# Patient Record
Sex: Male | Born: 1990 | Race: White | Hispanic: No | Marital: Single | Smoking: Never smoker
Health system: Southern US, Community
[De-identification: ages and names within clinical notes are randomized; demographics above are authoritative.]

---
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
## 2010-05-06 ENCOUNTER — Ambulatory Visit: Payer: Self-pay | Admitting: Emergency Medicine

## 2010-09-16 NOTE — Assessment & Plan Note (Signed)
Summary: SORE THROAT/TJ   Vital Signs:  Patient Profile:   20 Years Old Male CC:      Cold & URI symptoms Height:     73 inches Weight:      149 pounds O2 Sat:      100 % O2 treatment:    Room Air Temp:     98.3 degrees F oral Pulse rate:   62 / minute Pulse rhythm:   regular Resp:     16 per minute BP sitting:   124 / 80  (left arm) Cuff size:   regular  Vitals Entered By: Areta Haber CMA (May 06, 2010 5:30 PM)                  Current Allergies: No known allergies History of Present Illness History from: patient & mother Chief Complaint: Cold & URI symptoms History of Present Illness: Patient complains of onset of cold symptoms for 1 days.  They have been using no OTC meds.  His girlfriend has strep and is being treated with antibioticst. + sore throat + cough No pleuritic pain No wheezing No nasal congestion No post-nasal drainage No sinus pain/pressure No itchy/red eyes No earache No hemoptysis No SOB No chills/sweats No nausea No vomiting No abdominal pain No diarrhea No skin rashes No fatigue No myalgias No headache   Current Problems: UPPER RESPIRATORY INFECTION, ACUTE (ICD-465.9)   Current Meds AMOXICILLIN 875 MG TABS (AMOXICILLIN) 1 tab by mouth two times a day for 10 days  REVIEW OF SYSTEMS Constitutional Symptoms      Denies fever, chills, night sweats, weight loss, weight gain, and fatigue.  Eyes       Denies change in vision, eye pain, eye discharge, glasses, contact lenses, and eye surgery. Ear/Nose/Throat/Mouth       Complains of sore throat and hoarseness.      Denies hearing loss/aids, change in hearing, ear pain, ear discharge, dizziness, frequent runny nose, frequent nose bleeds, sinus problems, and tooth pain or bleeding.      Comments: x 1 dy Respiratory       Complains of dry cough.      Denies productive cough, wheezing, shortness of breath, asthma, bronchitis, and emphysema/COPD.  Cardiovascular       Denies  murmurs, chest pain, and tires easily with exhertion.    Gastrointestinal       Denies stomach pain, nausea/vomiting, diarrhea, constipation, blood in bowel movements, and indigestion. Genitourniary       Denies painful urination, kidney stones, and loss of urinary control. Neurological       Complains of headaches.      Denies paralysis, seizures, and fainting/blackouts. Musculoskeletal       Denies muscle pain, joint pain, joint stiffness, decreased range of motion, redness, swelling, muscle weakness, and gout.  Skin       Denies bruising, unusual mles/lumps or sores, and hair/skin or nail changes.  Psych       Denies mood changes, temper/anger issues, anxiety/stress, speech problems, depression, and sleep problems.  Past History:  Past Medical History: Unremarkable  Past Surgical History: Denies surgical history  Social History: Single Never Smoked Alcohol use-no Drug use-no Regular exercise-yes Smoking Status:  never Drug Use:  no Does Patient Exercise:  yes Physical Exam General appearance: well developed, well nourished, no acute distress Nasal: clear discharge Oral/Pharynx: pharyngeal erythema without exudate, uvula midline without deviation.  moderate clear PND Neck: ant cerv tender LAD Chest/Lungs: no rales, wheezes, or rhonchi  bilateral, breath sounds equal without effort Heart: regular rate and  rhythm, no murmur Skin: no obvious rashes or lesions MSE: oriented to time, place, and person Assessment New Problems: UPPER RESPIRATORY INFECTION, ACUTE (ICD-465.9)  sore throat Rapid strep is negative  Patient Education: Patient and/or caregiver instructed in the following: rest, fluids, Tylenol prn, Ibuprofen prn. Sudafed, Claritin  Plan New Medications/Changes: AMOXICILLIN 875 MG TABS (AMOXICILLIN) 1 tab by mouth two times a day for 10 days  #20 x 0, 05/06/2010, Hoyt Koch MD  New Orders: New Patient Level III [id number] Rapid Strep  [60454] Planning Comments:   Take the antibiotics if not improving or worsening in 2-3 days   The patient and/or caregiver has been counseled thoroughly with regard to medications prescribed including dosage, schedule, interactions, rationale for use, and possible side effects and they verbalize understanding.  Diagnoses and expected course of recovery discussed and will return if not improved as expected or if the condition worsens. Patient and/or caregiver verbalized understanding.  Prescriptions: AMOXICILLIN 875 MG TABS (AMOXICILLIN) 1 tab by mouth two times a day for 10 days  #20 x 0   Entered and Authorized by:   Hoyt Koch MD   Signed by:   Hoyt Koch MD on 05/06/2010   Method used:   Print then Give to Patient   RxID:   [id number]   Orders Added: 1)  New Patient Level III [30865] 2)  Rapid Strep [78469]  Laboratory Results  Date/Time Received: May 06, 2010 6:02 PM  Date/Time Reported: May 06, 2010 6:02 PM   Other Tests  Rapid Strep: negative  Kit Test Internal QC: Negative   (Normal Range: Negative)

## 2011-02-02 ENCOUNTER — Encounter: Payer: Self-pay | Admitting: Family Medicine

## 2011-02-02 ENCOUNTER — Inpatient Hospital Stay (INDEPENDENT_AMBULATORY_CARE_PROVIDER_SITE_OTHER)
Admission: RE | Admit: 2011-02-02 | Discharge: 2011-02-02 | Disposition: A | Discharge status: Home / Self Care | Payer: BC Managed Care – PPO | Source: Ambulatory Visit | Attending: Family Medicine | Admitting: Family Medicine

## 2011-02-02 DIAGNOSIS — K4090 Unilateral inguinal hernia, without obstruction or gangrene, not specified as recurrent: Secondary | ICD-10-CM | POA: Insufficient documentation

## 2011-02-02 DIAGNOSIS — R109 Unspecified abdominal pain: Secondary | ICD-10-CM | POA: Insufficient documentation

## 2011-02-03 ENCOUNTER — Encounter (INDEPENDENT_AMBULATORY_CARE_PROVIDER_SITE_OTHER): Payer: Self-pay | Admitting: General Surgery

## 2011-03-02 ENCOUNTER — Other Ambulatory Visit (INDEPENDENT_AMBULATORY_CARE_PROVIDER_SITE_OTHER): Payer: Self-pay | Admitting: General Surgery

## 2011-03-02 ENCOUNTER — Encounter (HOSPITAL_COMMUNITY): Payer: BC Managed Care – PPO

## 2011-03-02 LAB — SURGICAL PCR SCREEN
MRSA, PCR: NEGATIVE
Staphylococcus aureus: NEGATIVE

## 2011-03-09 ENCOUNTER — Ambulatory Visit (HOSPITAL_COMMUNITY)
Admission: RE | Admit: 2011-03-09 | Discharge: 2011-03-09 | Disposition: A | Discharge status: Home / Self Care | Payer: BC Managed Care – PPO | Source: Ambulatory Visit | Attending: General Surgery | Admitting: General Surgery

## 2011-03-09 DIAGNOSIS — K4090 Unilateral inguinal hernia, without obstruction or gangrene, not specified as recurrent: Secondary | ICD-10-CM

## 2011-03-09 DIAGNOSIS — Z01812 Encounter for preprocedural laboratory examination: Secondary | ICD-10-CM | POA: Insufficient documentation

## 2011-03-09 HISTORY — PX: INGUINAL HERNIA REPAIR: SUR1180

## 2011-03-09 NOTE — Op Note (Signed)
Carlos Harvey, Carlos Harvey                ACCOUNT NO.:  [id number]  MEDICAL RECORD NO.:  [id number]  LOCATION:  DAYL                         FACILITY:  [facility]  PHYSICIAN:  Angelia Mould. Derrell Lolling, M.D.DATE OF BIRTH:  [birth year]  DATE OF PROCEDURE:  03/09/2011 DATE OF DISCHARGE:                              OPERATIVE REPORT   PREOPERATIVE DIAGNOSIS:  Left inguinal hernia.  POSTOPERATIVE DIAGNOSIS:  Left inguinal hernia.  OPERATION PERFORMED:  Laparoscopic repair of left inguinal hernia with mesh (TEP).  SURGEON:  Angelia Mould. Derrell Lolling, M.D.  OPERATIVE INDICATIONS:  This is a healthy 20 year old gentleman who presented recently with a painful left inguinal hernia.  He has no prior history of hernia problems but recently has developed a painful bulge in his left groin, especially when he is lifting at work.  This has never been incarcerated.  He was evaluated as an outpatient.  There was no evidence of hernia on the right side.  He wanted to go ahead and have this repaired because of his pain.  He is brought to operating room electively.  OPERATIVE TECHNIQUE:  Following induction of general endotracheal anesthesia, surgical time-out was held, identifying correct patient, correct procedure and correct site.  A Foley catheter was inserted, the bladder emptied, the Foley taped to his leg and the balloon deflated, catheter left in place temporarily.  The abdomen and genitalia were prepped and draped in sterile fashion.  Intravenous antibiotics were given.  Esperal was used as local infiltration anesthetic.  Transverse incision was made at the lower rim of the umbilicus.  The fascia was incised transversely, exposing the medial border of the left rectus muscle.  The space maker balloon was inserted into the left rectus sheath behind the left rectus muscle.  Video cam was inserted.  The balloon was inflated manually with air under direct vision.  We had good deployment of the balloon bilaterally.  We  had good visualization of the rectus muscles anteriorly, inferior epigastric vessels anteriorly, preperitoneal fat posteriorly, and Cooper's ligament inferiorly.  We held the balloon in place for 4 minutes.  The balloon was deflated and removed.  The trocar balloon was inflated and secured and connected to the insufflator at 13 mmHg.  The pneumoperitoneum was created.  We could see well.  5-mm trocar was placed in the midline below the umbilicus. Through this trocar, I cleaned off the fascia in the right lower quadrant and then inserted a second 5-mm trocar in the right lower quadrant.  Through these two trocars, I then cleaned off the left lower quadrant abdominal wall, taking the preperitoneal tissues away.  We identified the symphysis pubis and Cooper's ligament.  We isolated the cord structures.  We pulled the indirect hernia sac out and dissected this away from the cord structures.  The sac appeared to be a simple sac and there were no contents.  This was stripped away from the cord structures and dissected back above the level of the anterior-superior iliac spine.  We inspected the area above Cooper's ligament and there was no evidence of a direct hernia.  3 inches x 6 inches piece of Ultrapro mesh was inserted and then opened up transversely overlying the  inguinal floor.  The mesh was positioned so as to cross the midline slightly, across Cooper's ligament inferiorly slightly and with that the mesh deployed laterally very nicely.  The mesh was secured with a 5-mm titanium ProTack device.  We put about 4 tacks along the superior rim of Cooper's ligament, then up along the midline and then across the posterior belly of rectus muscle.  Lateral to the inferior epigastric vessels, we made sure that we could palpate the tacker each time we fired it to be sure that we were above the iliopubic tract.  This provided very secure repair of both medial and lateral to the internal ring.  We  could see the indirect sac well below the lower edge of the mesh.  I felt everything was satisfactory.  There was no bleeding.  The trocars were removed and pneumoperitoneum was released.  We had to place a Veress needle in the right upper quadrant to release pneumoperitoneum but that was done uneventfully.  The fascia at the umbilicus closed with two interrupted figure-of-eight sutures of 0 Vicryl.  All the skin incisions were closed with subcuticular sutures of 4-0 Monocryl and Dermabond. The patient tolerated the procedure well and was taken to recovery room in excellent condition.  Estimated blood loss was about 10 mL. Complications were none.  Sponge, needle and instrument counts were correct.     Angelia Mould. Derrell Lolling, M.D.     HMI/MEDQ  D:  03/09/2011  T:  03/09/2011  Job:  [id number]  cc:   Dr. Hassan Rowan  Electronically Signed by Claud Kelp M.D. on 03/09/2011 05:51:23 PM

## 2011-03-12 ENCOUNTER — Telehealth (INDEPENDENT_AMBULATORY_CARE_PROVIDER_SITE_OTHER): Payer: Self-pay

## 2011-03-12 NOTE — Telephone Encounter (Signed)
LMOM for pt to call for po appt. cs 

## 2011-04-03 ENCOUNTER — Encounter (INDEPENDENT_AMBULATORY_CARE_PROVIDER_SITE_OTHER): Payer: Self-pay | Admitting: Surgery

## 2011-04-03 ENCOUNTER — Ambulatory Visit (INDEPENDENT_AMBULATORY_CARE_PROVIDER_SITE_OTHER): Payer: Self-pay | Admitting: Surgery

## 2011-04-03 VITALS — BP 134/84 | HR 71 | Wt 173.0 lb

## 2011-04-03 DIAGNOSIS — K4090 Unilateral inguinal hernia, without obstruction or gangrene, not specified as recurrent: Secondary | ICD-10-CM | POA: Insufficient documentation

## 2011-04-03 NOTE — Patient Instructions (Signed)
  COCOA BUTTER & VITAMIN E CREAM  ([employer] or other brand)  Apply cocoa butter/vitamin E cream to your incision 2 - 3 times daily.  Massage cream into incision for one minute with each application.  Use sunscreen (50 SPF or higher) for first 6 months after surgery.  You may substitute Mederma or other scar reducing creams as desired.   

## 2011-04-03 NOTE — Progress Notes (Signed)
Visit Diagnoses: 1. Inguinal hernia unilateral, non-recurrent, left     HISTORY: Patient is a 20 year old white male who underwent laparoscopic left inguinal hernia repair 3 weeks ago. He returns for his postoperative visit.   EXAM: Examination of the abdomen shows 4 well-healed surgical incisions. No sign of infection. No sign of seroma. No sign of recurrent herniation. Palpation in the left inguinal canal with cough and Valsalva and shows no sign of recurrence.   IMPRESSION: Status post laparoscopic left inguinal hernia repair without complication   PLAN: Patient will gradually resume normal activities with our stricturing. He will apply topical creams to his incisions. He will return to see [place] as needed.   Velora Heckler, MD, FACS General & Endocrine Surgery [facility]

## 2011-07-20 NOTE — Progress Notes (Signed)
Summary: possible hernia (rm 2)   Vital Signs:  Patient Profile:   20 Years Old Male CC:      left inguinal pain x 3-4 days Height:     73 inches Weight:      171 pounds O2 Sat:      99 % O2 treatment:    Room Air Temp:     98.6 degrees F oral Pulse rate:   76 / minute Resp:     16 per minute BP sitting:   134 / 81  (left arm) Cuff size:   regular  Pt. in pain?   yes    Location:   left inguinal    Intensity:   8    Type:       sharp/sore  Vitals Entered By: Lajean Saver RN (February 02, 2011 4:59 PM)                   Prior Medication List:  AMOXICILLIN 875 MG TABS (AMOXICILLIN) 1 tab by mouth two times a day for 10 days   Updated Prior Medication List: No Medications Current Allergies: No known allergies History of Present Illness Chief Complaint: left inguinal pain x 3-4 days History of Present Illness: AS previously noted abdominal pain in L inguinal area for 3-4 days. No nausea or vomiting noted. Pain started while at work and continued.   Current Meds MOBIC 7.5 MG TABS (MELOXICAM) 1 po  q day as needed for pain  REVIEW OF SYSTEMS Constitutional Symptoms      Denies fever, chills, night sweats, weight loss, weight gain, and fatigue.  Eyes       Denies change in vision, eye pain, eye discharge, glasses, contact lenses, and eye surgery. Ear/Nose/Throat/Mouth       Denies hearing loss/aids, change in hearing, ear pain, ear discharge, dizziness, frequent runny nose, frequent nose bleeds, sinus problems, sore throat, hoarseness, and tooth pain or bleeding.  Respiratory       Denies dry cough, productive cough, wheezing, shortness of breath, asthma, bronchitis, and emphysema/COPD.  Cardiovascular       Denies murmurs, chest pain, and tires easily with exhertion.    Gastrointestinal       Denies stomach pain, nausea/vomiting, diarrhea, constipation, blood in bowel movements, and indigestion. Genitourniary       Denies painful urination, blood or discharge from  penis, kidney stones, and loss of urinary control. Neurological       Denies paralysis, seizures, and fainting/blackouts. Musculoskeletal       Complains of swelling.      Denies muscle pain, joint pain, joint stiffness, decreased range of motion, redness, muscle weakness, and gout.  Skin       Denies bruising, unusual mles/lumps or sores, and hair/skin or nail changes.  Psych       Denies mood changes, temper/anger issues, anxiety/stress, speech problems, depression, and sleep problems. Other Comments: left inguinal pain x 3-4 days. Swelling.    Past History:  Social History: Last updated: 05/06/2010 Single Never Smoked Alcohol use-no Drug use-no Regular exercise-yes  Risk Factors: Exercise: yes (05/06/2010)  Risk Factors: Smoking Status: never (05/06/2010)  Past Medical History: Reviewed history from 05/06/2010 and no changes required. Unremarkable  Past Surgical History: Reviewed history from 05/06/2010 and no changes required. Denies surgical history  Family History: Reviewed history and no changes required.  Social History: Reviewed history from 05/06/2010 and no changes required. Single Never Smoked Alcohol use-no Drug use-no Regular exercise-yes Physical Exam General appearance:  well developed, well nourished, no acute distress Head: normocephalic, atraumatic Abdomen: soft, non-tender without obvious organomegaly tenderness in the L inguinal ring area                                                                                                                Skin: no obvious rashes or lesions MSE: oriented to time, place, and person Assessment New Problems: INGUINAL HERNIA, LEFT (ICD-550.90) ABDOMINAL PAIN OTHER SPECIFIED SITE (ICD-789.09)  possible L inguinal        Patient Education: Patient and/or caregiver instructed in the following: rest fluids and Tylenol.  Plan New Medications/Changes: MOBIC 7.5 MG TABS (MELOXICAM) 1 po  q day as needed  for pain  #30 x 0, 02/02/2011, Hassan Rowan MD  New Orders: Surgical Referral [Surgery] Est. Patient Level III [phone] Planning Comments:   surgical treferal to be initiated  Follow Up: Follow up in 2-3 days if no improvement, Follow up on an as needed basis  The patient and/or caregiver has been counseled thoroughly with regard to medications prescribed including dosage, schedule, interactions, rationale for use, and possible side effects and they verbalize understanding.  Diagnoses and expected course of recovery discussed and will return if not improved as expected or if the condition worsens. Patient and/or caregiver verbalized understanding.  Prescriptions: MOBIC 7.5 MG TABS (MELOXICAM) 1 po  q day as needed for pain  #30 x 0   Entered and Authorized by:   Hassan Rowan MD   Signed by:   Hassan Rowan MD on 02/02/2011   Method used:   Printed then faxed to ...       [facility] [phone]* (retail)       [address] [place], [place]  [address]       Ph: [phone]       Fax: [phone]   RxID:   [id number]   Patient Instructions: 1)  if abdominal pain gets worse please go to local ED for futher evaluation 2)  Please se instructions about surgical referal  Orders Added: 1)  Surgical Referral [Surgery] 2)  Est. Patient Level III [56387]

## 2012-12-12 ENCOUNTER — Encounter: Payer: Self-pay | Admitting: *Deleted

## 2012-12-12 ENCOUNTER — Emergency Department
Admission: EM | Admit: 2012-12-12 | Discharge: 2012-12-12 | Disposition: A | Discharge status: Home / Self Care | Payer: BC Managed Care – PPO | Source: Home / Self Care | Attending: Family Medicine | Admitting: Family Medicine

## 2012-12-12 DIAGNOSIS — S8011XA Contusion of right lower leg, initial encounter: Secondary | ICD-10-CM

## 2012-12-12 DIAGNOSIS — S8010XA Contusion of unspecified lower leg, initial encounter: Secondary | ICD-10-CM

## 2012-12-12 NOTE — ED Provider Notes (Signed)
History     CSN: [id number]  Arrival date & time 12/12/12  1612   First MD Initiated Contact with Patient 12/12/12 1624      Chief Complaint  Patient presents with  . Leg Pain       HPI Comments: Patient bumped his right lower anterior leg on the edge of a container two days ago and has had persistent mild swelling and soreness.  No calf tenderness  Patient is a 22 y.o. male presenting with leg pain. The history is provided by the patient.  Leg Pain Location:  Leg Time since incident:  2 days Injury: yes   Leg location:  R lower leg Pain details:    Quality:  Burning   Radiates to:  Does not radiate   Severity:  Mild   Duration:  2 days   Timing:  Constant   Progression:  Improving Chronicity:  New Prior injury to area:  No Relieved by:  None tried Associated symptoms: swelling   Associated symptoms: no decreased ROM, no fatigue, no fever, no itching, no numbness, no stiffness and no tingling     Past Medical History  Diagnosis Date  . Inguinal hernia     left    Past Surgical History  Procedure Laterality Date  . Lipoma excision  2007    buttock  . Inguinal hernia repair  03/09/11    left    History reviewed. No pertinent family history.  History  Substance Use Topics  . Smoking status: Never Smoker   . Smokeless tobacco: Not on file  . Alcohol Use: No      Review of Systems  Constitutional: Negative for fever and fatigue.  Musculoskeletal: Negative for stiffness.  Skin: Negative for itching.  All other systems reviewed and are negative.    Allergies  Review of patient's allergies indicates no known allergies.  Home Medications   Current Outpatient Rx  Name  Route  Sig  Dispense  Refill  . meloxicam (MOBIC) 7.5 MG tablet               . oxyCODONE-acetaminophen (PERCOCET) 5-325 MG per tablet                 BP 139/84  Pulse 90  Temp(Src) 98.2 F (36.8 C) (Oral)  Resp 90  Wt 194 lb (87.998 kg)  BMI 25.6 kg/m2  SpO2  97%  Physical Exam  Nursing note and vitals reviewed. Constitutional: He is oriented to person, place, and time. He appears well-developed and well-nourished. No distress.  HENT:  Head: Atraumatic.  Eyes: Conjunctivae are normal. Pupils are equal, round, and reactive to light.  Cardiovascular: Normal heart sounds.   Pulmonary/Chest: Breath sounds normal.  Musculoskeletal: Normal range of motion. He exhibits tenderness. He exhibits no edema.       Legs: There is mild tenderness in right lateral anterior compartment as noted on diagram.  No calf tenderness.  Distal neurovascular function is intact.   Neurological: He is alert and oriented to person, place, and time.  Skin: Skin is warm.    ED Course  Procedures  none      1. Contusion of right lower leg, initial encounter       MDM  Ace wrap applied in graduated compression fashion; wear daytime until swelling resolves.  Wear ace wrap daily as instructed.  May continue to apply ice pack two or three times daily until swelling resolves.  May take Ibuprofen 200mg , 3 or 4 tabs every  8 hours with food.  Followup with [facility] if not improving about two weeks.        Lattie Haw, MD 12/16/12 1254

## 2012-12-12 NOTE — ED Notes (Signed)
Pt c/o RLE burning, pain, and numbness x 1 day after bumping it against a container.

## 2012-12-16 ENCOUNTER — Telehealth: Payer: Self-pay | Admitting: Emergency Medicine

## 2013-08-28 ENCOUNTER — Encounter: Payer: Self-pay | Admitting: Emergency Medicine

## 2013-08-28 ENCOUNTER — Other Ambulatory Visit: Payer: Self-pay | Admitting: Family Medicine

## 2013-08-28 ENCOUNTER — Emergency Department
Admission: EM | Admit: 2013-08-28 | Discharge: 2013-08-28 | Disposition: A | Discharge status: Home / Self Care | Payer: BC Managed Care – PPO | Source: Home / Self Care | Attending: Family Medicine | Admitting: Family Medicine

## 2013-08-28 DIAGNOSIS — Z2089 Contact with and (suspected) exposure to other communicable diseases: Secondary | ICD-10-CM

## 2013-08-28 DIAGNOSIS — Z202 Contact with and (suspected) exposure to infections with a predominantly sexual mode of transmission: Secondary | ICD-10-CM

## 2013-08-28 MED ORDER — OSELTAMIVIR PHOSPHATE 75 MG PO CAPS: 75.0000 mg | ORAL_CAPSULE | Freq: Two times a day (BID) | ORAL | Status: DC

## 2013-08-28 NOTE — ED Provider Notes (Signed)
CSN: [id number]     Arrival date & time 08/28/13  1445 History   First MD Initiated Contact with Patient 08/28/13 1520     Chief Complaint  Patient presents with  . Exposure to STD  . Chills      HPI Comments: Patient reports that he was possibly exposed to STD one month ago, and his partner tested positive for chlamydia.  He has had no GU symptoms. He also complains of feeling tired yesterday with chills, headache, and myalgias.  He feels well today, however.  Patient is a 23 y.o. male presenting with STD exposure. The history is provided by the patient.  Exposure to STD This is a new problem. Episode onset: 1 month ago. The problem has not changed since onset.Associated symptoms comments: none.    Past Medical History  Diagnosis Date  . Inguinal hernia     left   Past Surgical History  Procedure Laterality Date  . Lipoma excision  2007    buttock  . Inguinal hernia repair  03/09/11    left   No family history on file. History  Substance Use Topics  . Smoking status: Never Smoker   . Smokeless tobacco: Not on file  . Alcohol Use: No    Review of Systems  All other systems reviewed and are negative.    Allergies  Review of patient's allergies indicates not on file.  Home Medications   Current Outpatient Rx  Name  Route  Sig  Dispense  Refill  . aspirin-acetaminophen-caffeine (EXCEDRIN MIGRAINE) 250-250-65 MG per tablet   Oral   Take by mouth every 6 (six) hours as needed for headache.         . oseltamivir (TAMIFLU) 75 MG capsule   Oral   Take 1 capsule (75 mg total) by mouth every 12 (twelve) hours. (Rx void after 09/05/13)   10 capsule   0    BP 132/83  Pulse 72  Temp(Src) 98.3 F (36.8 C) (Oral)  Ht 6\' 1"  (1.854 m)  Wt 191 lb (86.637 kg)  BMI 25.20 kg/m2  SpO2 100% Physical Exam Nursing notes and Vital Signs reviewed. Appearance:  Patient appears healthy, stated age, and in no acute distress Eyes:  Pupils are equal, round, and reactive to light  and accomodation.  Extraocular movement is intact.  Conjunctivae are not inflamed  Ears:  Canals normal.  Tympanic membranes normal.  Nose:  Normal turbinates.  No sinus tenderness.  Mouth:  No lesions   Pharynx:  Normal Neck:  Supple.  No adenopathy Lungs:  Clear to auscultation.  Breath sounds are equal.  Heart:  Regular rate and rhythm without murmurs, rubs, or gallops.  Abdomen:  Nontender without masses or hepatosplenomegaly.  Bowel sounds are present.  No CVA or flank tenderness.  Extremities:  No edema.  No calf tenderness Skin:  No rash present.   ED Course  Procedures  None    Labs Reviewed  HIV ANTIBODY (ROUTINE TESTING)  GC/CHLAMYDIA PROBE AMP, URINE  RPR  HSV 1 ANTIBODY, IGG  HSV 2 ANTIBODY, IGG         MDM   1. Exposure to STD         Flu symptoms, now resolved.  Normal exam.  GC/chlamydia, RPR, HIV, HSV1, 2 pending Call if more distinct flu symptoms develop (will write Rx for Tamiflu).    Lattie HawStephen A Louvinia Cumbo, MD 08/29/13 [phone]

## 2013-08-28 NOTE — ED Notes (Signed)
Exposure to STD x 1 month ago, partner tested positive for chlamydia, denies sx no penial discharge, dysuria.

## 2013-08-28 NOTE — ED Notes (Signed)
Chills, body aches, migraine x 2 days

## 2013-08-28 NOTE — Discharge Instructions (Signed)
Begin Tamiflu if flu symptoms develop.

## 2013-08-29 ENCOUNTER — Telehealth: Payer: Self-pay | Admitting: *Deleted

## 2013-08-29 LAB — HIV ANTIBODY (ROUTINE TESTING W REFLEX): HIV: NONREACTIVE

## 2013-08-29 LAB — GC/CHLAMYDIA PROBE AMP
CT Probe RNA: POSITIVE — AB
GC PROBE AMP APTIMA: NEGATIVE

## 2013-08-29 LAB — RPR

## 2013-08-29 LAB — HSV 1 ANTIBODY, IGG: HSV 1 Glycoprotein G Ab, IgG: <0.1 IV

## 2013-08-29 LAB — HSV 2 ANTIBODY, IGG: HSV 2 Glycoprotein G Ab, IgG: <0.1 IV

## 2013-11-06 ENCOUNTER — Ambulatory Visit (INDEPENDENT_AMBULATORY_CARE_PROVIDER_SITE_OTHER): Payer: Self-pay

## 2013-11-06 ENCOUNTER — Other Ambulatory Visit: Payer: Self-pay | Admitting: Adult Health

## 2013-11-06 DIAGNOSIS — T1490XA Injury, unspecified, initial encounter: Secondary | ICD-10-CM

## 2013-11-06 DIAGNOSIS — X58XXXA Exposure to other specified factors, initial encounter: Secondary | ICD-10-CM

## 2013-11-07 ENCOUNTER — Other Ambulatory Visit: Payer: Self-pay | Admitting: Sports Medicine

## 2013-11-07 DIAGNOSIS — S022XXA Fracture of nasal bones, initial encounter for closed fracture: Secondary | ICD-10-CM | POA: Insufficient documentation
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
## 2013-11-08 ENCOUNTER — Encounter: Payer: Self-pay | Admitting: Sports Medicine

## 2013-11-08 ENCOUNTER — Ambulatory Visit (INDEPENDENT_AMBULATORY_CARE_PROVIDER_SITE_OTHER): Payer: BC Managed Care – PPO | Admitting: Sports Medicine

## 2013-11-08 VITALS — BP 150/83 | HR 84 | Wt 199.0 lb

## 2013-11-08 DIAGNOSIS — S060XAA Concussion with loss of consciousness status unknown, initial encounter: Secondary | ICD-10-CM | POA: Insufficient documentation

## 2013-11-08 DIAGNOSIS — S060X9A Concussion with loss of consciousness of unspecified duration, initial encounter: Secondary | ICD-10-CM

## 2013-11-08 DIAGNOSIS — S022XXA Fracture of nasal bones, initial encounter for closed fracture: Secondary | ICD-10-CM

## 2013-11-08 MED ORDER — HYDROCODONE-ACETAMINOPHEN 5-325 MG PO TABS: 1.0000 | ORAL_TABLET | Freq: Three times a day (TID) | ORAL | Status: DC | PRN

## 2013-11-08 NOTE — Assessment & Plan Note (Addendum)
This will heal well it's own. He does have some right-sided nasal obstruction likely due to swelling which I think will resolve. Icing, I do need a CT scan considering tenderness all the way around his maxilla and orbit. Hydrocodone for pain, he also has a concussion, out of work for a week.  I billed a fracture code for this visit, all subsequent visits for this complaint will be "post-op checks" in the global period.

## 2013-11-08 NOTE — Assessment & Plan Note (Signed)
Complete physical and cognitive rest, out of work for at least one week.

## 2013-11-08 NOTE — Progress Notes (Signed)
  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 He did lose consciousness, and had immediate pain and swelling, predominantly localized over his right nasal bone. He was seen in employer health, where x-rays showed suspicion for a nasal bone fracture. Unfortunately he also endorses some dizziness and difficulty concentrating, pain is moderate, persistent. We did order a CT scan yesterday, he is scheduled for today at approximately 1 PM.  Past medical history, Surgical history, Family history not pertinant except as noted below, Social history, Allergies, and medications have been entered into the medical record, reviewed, and no changes needed.   Review of Systems: No headache, visual changes, nausea, vomiting, diarrhea, constipation, dizziness, abdominal pain, skin rash, fevers, chills, night sweats, weight loss, swollen lymph nodes, body aches, joint swelling, muscle aches, chest pain, shortness of breath, mood changes, visual or auditory hallucinations.   Objective:   General: Well Developed, well nourished, and in no acute distress.  Neuro/Psych: Alert and oriented x3, extra-ocular muscles intact, able to move all 4 extremities, sensation grossly intact. He is unable to perform tandem or single leg balance. Skin: Warm and dry, no rashes noted.  Respiratory: Not using accessory muscles, speaking in full sentences, trachea midline.  Cardiovascular: Pulses palpable, no extremity edema. Abdomen: Does not appear distended. Face: There is bruising and swelling over his superior, lateral, and inferior orbits, there is also discrete tenderness to palpation without palpable deformity  over the right nasal bone. Pain is severe, persistent.   X-rays were personally reviewed and do show a nondisplaced fracture through the nasal bone.  Impression and Recommendations:   This case required medical decision making of moderate complexity.

## 2013-11-09 ENCOUNTER — Ambulatory Visit (INDEPENDENT_AMBULATORY_CARE_PROVIDER_SITE_OTHER): Payer: Worker's Compensation | Admitting: Sports Medicine

## 2013-11-09 ENCOUNTER — Encounter: Payer: Self-pay | Admitting: Sports Medicine

## 2013-11-09 VITALS — BP 151/83 | HR 70 | Ht 73.0 in | Wt 195.0 lb

## 2013-11-09 DIAGNOSIS — S060X9A Concussion with loss of consciousness of unspecified duration, initial encounter: Secondary | ICD-10-CM

## 2013-11-09 DIAGNOSIS — S060XAA Concussion with loss of consciousness status unknown, initial encounter: Secondary | ICD-10-CM

## 2013-11-09 DIAGNOSIS — M6740 Ganglion, unspecified site: Secondary | ICD-10-CM

## 2013-11-09 DIAGNOSIS — S022XXA Fracture of nasal bones, initial encounter for closed fracture: Secondary | ICD-10-CM

## 2013-11-09 NOTE — Progress Notes (Signed)
  Workers comp  Date of injury: November 06, 2013 Employer: [employer] Work Status: Out of work  Subjective:    CC: Followup CT results  HPI: Carlos Harvey returns for followup of an injury Carlos Harvey sustained at work, Carlos Harvey had an x-ray that was suggestive of a nasal bone fracture, and an exam with exquisite tenderness to palpation over the right nasal bones. Carlos Harvey did have a CT scan and returns with the disc for our review. Carlos Harvey continues to have concussive symptoms as well, moderate, persistent, Carlos Harvey has been doing his best with physical and cognitive rest and is out of work.  Past medical history, Surgical history, Family history not pertinant except as noted below, Social history, Allergies, and medications have been entered into the medical record, reviewed, and no changes needed.   Review of Systems: No fevers, chills, night sweats, weight loss, chest pain, or shortness of breath.   Objective:    General: Well Developed, well nourished, and in no acute distress.  Neuro: Alert and oriented x3, extra-ocular muscles intact, sensation grossly intact.  HEENT: Normocephalic, atraumatic, pupils equal round reactive to light, neck supple, no masses, no lymphadenopathy, thyroid nonpalpable. Continued bruising and swelling, Carlos Harvey still has tenderness to palpation without deformity over the right nasal bone. Skin: Warm and dry, no rashes. Cardiac: Regular rate and rhythm, no murmurs rubs or gallops, no lower extremity edema.  Respiratory: Clear to auscultation bilaterally. Not using accessory muscles, speaking in full sentences.  CT scan was personally reviewed, although the radiologist does not describe any fractures of the nasal bone, on the sagittal images, I do see a small defect that is nondisplaced in the right nasal bone cortex suggestive of a small fracture. There are no further fractures through the orbit or zygomatic process.  Impression and Recommendations:

## 2013-11-09 NOTE — Assessment & Plan Note (Signed)
Persistent, continue cognitive and physical rest. Return to see me in a week to reevaluate as well as do balance testing.

## 2013-11-09 NOTE — Assessment & Plan Note (Signed)
Causing a little bit of pain. Velcro wrist brace. We will avoid injection until after his nasal fracture has healed.

## 2013-11-09 NOTE — Assessment & Plan Note (Signed)
CT scan does confirm fracture through the nasal bone, nondisplaced. This will take approximately 4-6 weeks to heal. Icing, narcotics or as needed.

## 2013-11-16 ENCOUNTER — Ambulatory Visit (INDEPENDENT_AMBULATORY_CARE_PROVIDER_SITE_OTHER): Payer: Worker's Compensation | Admitting: Sports Medicine

## 2013-11-16 ENCOUNTER — Encounter: Payer: Self-pay | Admitting: Sports Medicine

## 2013-11-16 VITALS — BP 145/87 | HR 79 | Ht 73.0 in | Wt 195.0 lb

## 2013-11-16 DIAGNOSIS — M6740 Ganglion, unspecified site: Secondary | ICD-10-CM | POA: Diagnosis not present

## 2013-11-16 DIAGNOSIS — S022XXA Fracture of nasal bones, initial encounter for closed fracture: Secondary | ICD-10-CM

## 2013-11-16 DIAGNOSIS — S060X9A Concussion with loss of consciousness of unspecified duration, initial encounter: Secondary | ICD-10-CM

## 2013-11-16 DIAGNOSIS — S060XAA Concussion with loss of consciousness status unknown, initial encounter: Secondary | ICD-10-CM

## 2013-11-16 MED ORDER — HYDROCODONE-ACETAMINOPHEN 7.5-325 MG PO TABS: 1.0000 | ORAL_TABLET | Freq: Three times a day (TID) | ORAL | Status: DC | PRN

## 2013-11-16 NOTE — Assessment & Plan Note (Signed)
50% improved. Some mild continued vestibular symptoms and headaches. Has been doing okay with physical and cognitive rest, only uses computer, TV, cell phone for about one hour per day. I think he needs an additional week out of work.

## 2013-11-16 NOTE — Assessment & Plan Note (Signed)
Had to double up on hydrocodone due to pain. Refilling hydrocodone 7.5, #30.

## 2013-11-16 NOTE — Progress Notes (Signed)
  Workers comp  Date of injury: November 06, 2013 Employer: [employer] Work Status: Out of work  Subjective:    CC: Follow up  HPI: Carlos Harvey is now 9 days post closed fracture of his right nasal bone, nondisplaced. This is improving significantly.  Concussion: Improved approximately 50%, moderate compliance with physical and cognitive rest. Balance is still off.  Ganglion cyst: Improving with wrist sleeve.  Past medical history, Surgical history, Family history not pertinant except as noted below, Social history, Allergies, and medications have been entered into the medical record, reviewed, and no changes needed.   Review of Systems: No fevers, chills, night sweats, weight loss, chest pain, or shortness of breath.   Objective:    General: Well Developed, well nourished, and in no acute distress.  Neuro: Alert and oriented x3, extra-ocular muscles intact, sensation grossly intact.  HEENT: Normocephalic, atraumatic, pupils equal round reactive to light, neck supple, no masses, no lymphadenopathy, thyroid nonpalpable. No tenderness palpation over either nasal bone, or the orbit. Skin: Warm and dry, no rashes. Cardiac: Regular rate and rhythm, no murmurs rubs or gallops, no lower extremity edema.  Respiratory: Clear to auscultation bilaterally. Not using accessory muscles, speaking in full sentences.  Impression and Recommendations:

## 2013-11-16 NOTE — Assessment & Plan Note (Signed)
We can revisit this once the nasal fracture has healed.

## 2013-11-23 ENCOUNTER — Ambulatory Visit (INDEPENDENT_AMBULATORY_CARE_PROVIDER_SITE_OTHER): Payer: Worker's Compensation | Admitting: Sports Medicine

## 2013-11-23 ENCOUNTER — Encounter: Payer: Self-pay | Admitting: Sports Medicine

## 2013-11-23 VITALS — BP 137/86 | HR 88 | Ht 73.0 in | Wt 197.0 lb

## 2013-11-23 DIAGNOSIS — S060XAA Concussion with loss of consciousness status unknown, initial encounter: Secondary | ICD-10-CM

## 2013-11-23 DIAGNOSIS — S060X9A Concussion with loss of consciousness of unspecified duration, initial encounter: Secondary | ICD-10-CM

## 2013-11-23 DIAGNOSIS — S022XXA Fracture of nasal bones, initial encounter for closed fracture: Secondary | ICD-10-CM

## 2013-11-23 MED ORDER — HYDROCODONE-ACETAMINOPHEN 5-325 MG PO TABS: 1.0000 | ORAL_TABLET | Freq: Three times a day (TID) | ORAL | Status: DC | PRN

## 2013-11-23 NOTE — Progress Notes (Signed)
  Subjective:    CC: Followup  HPI: Concussion: Continues to improve, only has mild headaches, still a little off balance, has been doing moderately well with physical and cognitive rest.  Nasal bone fracture doing well, is not using nearly as much hydrocodone now.  Past medical history, Surgical history, Family history not pertinant except as noted below, Social history, Allergies, and medications have been entered into the medical record, reviewed, and no changes needed.   Review of Systems: No fevers, chills, night sweats, weight loss, chest pain, or shortness of breath.   Objective:    General: Well Developed, well nourished, and in no acute distress.  Neuro: Alert and oriented x3, extra-ocular muscles intact, sensation grossly intact.  HEENT: Normocephalic, atraumatic, pupils equal round reactive to light, neck supple, no masses, no lymphadenopathy, thyroid nonpalpable.  Skin: Warm and dry, no rashes. Cardiac: Regular rate and rhythm, no murmurs rubs or gallops, no lower extremity edema.  Respiratory: Clear to auscultation bilaterally. Not using accessory muscles, speaking in full sentences.  Balance testing reveals continued inability to balance with tandem stance.  Impression and Recommendations:

## 2013-11-23 NOTE — Assessment & Plan Note (Signed)
Persistent concussive symptoms, he did have inability to balance with tandem stance. I think he needs an additional 2 weeks out however I do believe we will be clearing him for work at his return visit.

## 2013-11-23 NOTE — Assessment & Plan Note (Signed)
Pain is decreased over 70% since the day of injury. I am going to decrease his hydrocodone to 5 mg, with use sparingly.

## 2013-11-29 ENCOUNTER — Encounter: Payer: Self-pay | Admitting: Sports Medicine

## 2013-12-01 ENCOUNTER — Encounter: Payer: Self-pay | Admitting: Emergency Medicine

## 2013-12-01 ENCOUNTER — Emergency Department
Admission: EM | Admit: 2013-12-01 | Discharge: 2013-12-01 | Disposition: A | Discharge status: Home / Self Care | Payer: BC Managed Care – PPO | Source: Home / Self Care | Attending: Family Medicine | Admitting: Family Medicine

## 2013-12-01 DIAGNOSIS — Z113 Encounter for screening for infections with a predominantly sexual mode of transmission: Secondary | ICD-10-CM

## 2013-12-01 DIAGNOSIS — B356 Tinea cruris: Secondary | ICD-10-CM

## 2013-12-01 MED ORDER — NAFTIFINE HCL 1 % EX CREA: TOPICAL_CREAM | Freq: Every day | CUTANEOUS | Status: DC

## 2013-12-01 NOTE — Discharge Instructions (Signed)
May apply 1% Hydrocortisone cream once or twice daily for itching.  May take oral Benadryl 50mg  at bedtime for itching.   Jock Itch Jock itch is a fungal infection of the skin in the groin area. It is sometimes called "ringworm" even though it is not caused by a worm. A fungus is a type of germ that thrives in dark, damp places.  CAUSES  This infection may spread from:  A fungus infection elsewhere on the body (such as athlete's foot).  Sharing towels or clothing. This infection is more common in:  Hot, humid climates.  People who wear tight-fitting clothing or wet bathing suits for long periods of time.  Athletes.  Overweight people.  People with diabetes. SYMPTOMS  Jock itch causes the following symptoms:  Red, pink or brown rash in the groin. Rash may spread to the thighs, anus, and buttocks.  Itching. DIAGNOSIS  Your caregiver may make the diagnosis by looking at the rash. Sometimes a skin scraping will be sent to test for fungus. Testing can be done either by looking under the microscope or by doing a culture (test to try to grow the fungus). A culture can take up to 2 weeks to come back. TREATMENT  Jock itch may be treated with:  Skin cream or ointment to kill fungus.  Medicine by mouth to kill fungus.  Skin cream or ointment to calm the itching.  Compresses or medicated powders to dry the infected skin. HOME CARE INSTRUCTIONS   Be sure to treat the rash completely. Follow your caregiver's instructions. It can take a couple of weeks to treat. If you do not treat the infection long enough, the rash can come back.  Wear loose-fitting clothing.  Men should wear cotton boxer shorts.  Women should wear cotton underwear.  Avoid hot baths.  Dry the groin area well after bathing. SEEK MEDICAL CARE IF:   Your rash is worse.  Your rash is spreading.  Your rash returns after treatment is finished.  Your rash is not gone in 4 weeks. Fungal infections are slow to  respond to treatment. Some redness may remain for several weeks after the fungus is gone. SEEK IMMEDIATE MEDICAL CARE IF:  The area becomes red, warm, tender, and swollen.  You have a fever. Document Released: 07/24/2002 Document Revised: 10/26/2011 Document Reviewed: 06/22/2008 [facility] Patient Information 2014 [employer], [place].

## 2013-12-01 NOTE — ED Provider Notes (Signed)
CSN: [id number]     Arrival date & time 12/01/13  1145 History   First MD Initiated Contact with Patient 12/01/13 1201     Chief Complaint  Patient presents with  . Pruritis      HPI Comments: Patient complains of two week history of pruritic rash in his groin area.  No vesicles.  No urethral discharge.  No testicular pain.  He feels well otherwise. He reports that he had a positive chlamydia test in January, and requests followup test.  Patient is a 23 y.o. male presenting with rash. The history is provided by the patient.  Rash Pain location: groin. Pain quality comment:  Itching Pain severity:  Mild Onset quality:  Gradual Duration:  2 days Timing:  Constant Progression:  Worsening Chronicity:  New Relieved by:  Nothing Worsened by:  Nothing tried Ineffective treatments:  None tried Associated symptoms: no chills, no dysuria, no fever, no hematuria and no nausea     Past Medical History  Diagnosis Date  . Inguinal hernia     left   Past Surgical History  Procedure Laterality Date  . Lipoma excision  2007    buttock  . Inguinal hernia repair  03/09/11    left   No family history on file. History  Substance Use Topics  . Smoking status: Never Smoker   . Smokeless tobacco: Not on file  . Alcohol Use: No    Review of Systems  Constitutional: Negative for fever and chills.  Gastrointestinal: Negative for nausea.  Genitourinary: Negative for dysuria and hematuria.  Skin: Positive for rash.  All other systems reviewed and are negative.   Allergies  Review of patient's allergies indicates no known allergies.  Home Medications   Prior to Admission medications   Medication Sig Start Date End Date Taking? Authorizing Provider  aspirin-acetaminophen-caffeine (EXCEDRIN MIGRAINE) 4348660028250-250-65 MG per tablet Take by mouth every 6 (six) hours as needed for headache.    Historical Provider, MD  HYDROcodone-acetaminophen (NORCO/VICODIN) 5-325 MG per tablet Take 1 tablet by  mouth every 8 (eight) hours as needed for moderate pain. 11/23/13   Monica Bectonhomas J Thekkekandam, MD   BP 131/83  Temp(Src) 98.1 F (36.7 C) (Oral)  Ht 6\' 1"  (1.854 m)  Wt 194 lb (87.998 kg)  BMI 25.60 kg/m2  SpO2 98% Physical Exam Nursing notes and Vital Signs reviewed. Appearance:  Patient appears healthy, stated age, and in no acute distress Eyes:  Pupils are equal, round, and reactive to light and accomodation.  Extraocular movement is intact.  Conjunctivae are not inflamed   Pharynx:  Normal Neck:  Supple.  No adenopathy Lungs:  Clear to auscultation.  Breath sounds are equal.  Heart:  Regular rate and rhythm without murmurs, rubs, or gallops.  Abdomen:  Nontender without masses or hepatosplenomegaly.  Bowel sounds are present.  No CVA or flank tenderness.  Genitourinary:  Penis normal without lesions or urethral discharge.  Scrotum is normal.  No regional lymphadenopathy palpated.  There is mild macular erythema in bilateral intertriginous inguinal areas extending to scrotum.  No fluourescence by Harriett RushWood's light ED Course  Procedures      Labs Reviewed  GC/CHLAMYDIA PROBE AMP, URINE    Results for orders placed during the hospital encounter of 08/28/13  HIV ANTIBODY (ROUTINE TESTING)      Result Value Ref Range   HIV NON REACTIVE  NON REACTIVE  RPR      Result Value Ref Range   RPR NON REAC  NON REAC  HSV 1 ANTIBODY, IGG      Result Value Ref Range   HSV 1 Glycoprotein G Ab, IgG <0.10    HSV 2 ANTIBODY, IGG      Result Value Ref Range   HSV 2 Glycoprotein G Ab, IgG <0.10          MDM   1. Tinea cruris   2. Screen for STD (sexually transmitted disease)    Begin Naftin cream for about two weeks Check GC/chlamyia for follow-up at patient's request May apply 1% Hydrocortisone cream once or twice daily for itching.  May take oral Benadryl 50mg  at bedtime for itching.    Lattie HawStephen A Lizmarie Witters, MD 12/03/13 1054

## 2013-12-01 NOTE — ED Notes (Signed)
Scrotum is itching x 2 days, was told January 23 to F/U in 2 weeks positive Chlamydia, that was 3 months ago, he is here today for Chlamydia check. I explained to him that in the 3 month period reinfection is possible and it's been to long for a F/U.

## 2013-12-02 LAB — GC/CHLAMYDIA PROBE AMP, URINE
Chlamydia, Swab/Urine, PCR: NEGATIVE
GC Probe Amp, Urine: NEGATIVE

## 2013-12-04 ENCOUNTER — Telehealth: Payer: Self-pay | Admitting: *Deleted

## 2013-12-07 ENCOUNTER — Encounter: Payer: Self-pay | Admitting: Sports Medicine

## 2013-12-07 ENCOUNTER — Ambulatory Visit (INDEPENDENT_AMBULATORY_CARE_PROVIDER_SITE_OTHER): Payer: Worker's Compensation | Admitting: Sports Medicine

## 2013-12-07 VITALS — BP 128/81 | HR 81 | Ht 73.0 in | Wt 191.0 lb

## 2013-12-07 DIAGNOSIS — S022XXA Fracture of nasal bones, initial encounter for closed fracture: Secondary | ICD-10-CM

## 2013-12-07 DIAGNOSIS — S060XAA Concussion with loss of consciousness status unknown, initial encounter: Secondary | ICD-10-CM

## 2013-12-07 DIAGNOSIS — S060X9A Concussion with loss of consciousness of unspecified duration, initial encounter: Secondary | ICD-10-CM

## 2013-12-07 NOTE — Progress Notes (Signed)
  Workers comp  Date of injury: November 06, 2013  Employer: [employer]  Work Status: Out of work  Subjective:    CC: Followup  HPI: The patient returns, he is approximately one month post injury at work, with concussion. His balance is better, headaches are gone, and his concentration is much better. He is eager to get back into work.  Nasal fracture: Symptoms have resolved.  Past medical history, Surgical history, Family history not pertinant except as noted below, Social history, Allergies, and medications have been entered into the medical record, reviewed, and no changes needed.   Review of Systems: No fevers, chills, night sweats, weight loss, chest pain, or shortness of breath.   Objective:    General: Well Developed, well nourished, and in no acute distress.  Neuro: Alert and oriented x3, extra-ocular muscles intact, sensation grossly intact.  HEENT: Normocephalic, atraumatic, pupils equal round reactive to light, neck supple, no masses, no lymphadenopathy, thyroid nonpalpable.  Skin: Warm and dry, no rashes. Cardiac: Regular rate and rhythm, no murmurs rubs or gallops, no lower extremity edema.  Respiratory: Clear to auscultation bilaterally. Not using accessory muscles, speaking in full sentences.  Impression and Recommendations:

## 2013-12-07 NOTE — Assessment & Plan Note (Signed)
Pain resolved.   

## 2013-12-07 NOTE — Assessment & Plan Note (Signed)
Overall resolved. At this point we are going to start getting him back into work. This means a half day for a week of light duty, and a full day for a week of light duty, then full duty. Like to see him back at least one more time after a month of full duty to see how things are going.

## 2014-01-02 ENCOUNTER — Emergency Department
Admission: EM | Admit: 2014-01-02 | Discharge: 2014-01-02 | Disposition: A | Discharge status: Home / Self Care | Payer: BC Managed Care – PPO | Source: Home / Self Care | Attending: Emergency Medicine | Admitting: Emergency Medicine

## 2014-01-02 ENCOUNTER — Encounter: Payer: Self-pay | Admitting: Emergency Medicine

## 2014-01-02 DIAGNOSIS — J029 Acute pharyngitis, unspecified: Secondary | ICD-10-CM

## 2014-01-02 DIAGNOSIS — J209 Acute bronchitis, unspecified: Secondary | ICD-10-CM

## 2014-01-02 LAB — POCT RAPID STREP A (OFFICE): Rapid Strep A Screen: NEGATIVE

## 2014-01-02 MED ORDER — AZITHROMYCIN 250 MG PO TABS: ORAL_TABLET | ORAL | Status: DC

## 2014-01-02 NOTE — ED Notes (Signed)
Pt c/o cough, bilateral ear ache, sore throat, and runny nose x 2 days. Denies fever.

## 2014-01-02 NOTE — ED Provider Notes (Signed)
CSN: [id number]     Arrival date & time 01/02/14  1238 History   First MD Initiated Contact with Patient 01/02/14 1310     Chief Complaint  Patient presents with  . Sore Throat  . Otalgia  . Cough   (Consider location/radiation/quality/duration/timing/severity/associated sxs/prior Treatment) HPI URI HISTORY  Carlos Harvey is a 23 y.o. male who complains of sore throat, sinus congestion, discolored nasal mucus, progressing to a cough productive of yellow-green sputum. Symptoms for 3 days.  Have been using over-the-counter treatment, OTC decongestant and cough med, which helps somewhat.  Denies history of asthma or chronic lung disease or recurrent URIs. He denies seasonal allergies  No chills/sweats +  Fever  +  Nasal congestion +  Discolored Post-nasal drainage No sinus pain/pressure Positive sore throat  +  cough Yesterday, had minimal wheezing, not today Positive chest congestion No hemoptysis No shortness of breath No pleuritic pain  No itchy/red eyes Mild bilateral earache  No nausea No vomiting No abdominal pain No diarrhea  No skin rashes +  Fatigue No myalgias No headache   Past Medical History  Diagnosis Date  . Inguinal hernia     left   Past Surgical History  Procedure Laterality Date  . Lipoma excision  2007    buttock  . Inguinal hernia repair  03/09/11    left   History reviewed. No pertinent family history. History  Substance Use Topics  . Smoking status: Never Smoker   . Smokeless tobacco: Not on file  . Alcohol Use: No    Review of Systems  Allergies  Review of patient's allergies indicates no known allergies.  Home Medications   Prior to Admission medications   Medication Sig Start Date End Date Taking? Authorizing Provider  pseudoephedrine-acetaminophen (TYLENOL SINUS) 30-500 MG TABS Take 1 tablet by mouth every 4 (four) hours as needed.   Yes Historical Provider, MD  azithromycin (ZITHROMAX Z-PAK) 250 MG tablet Take 2 tablets on  day one, then 1 tablet daily on days 2 through 5 01/02/14   Lajean Manesavid Massey, MD   BP 133/80  Pulse 100  Temp(Src) 98 F (36.7 C) (Oral)  Resp 18  Ht 6\' 1"  (1.854 m)  Wt 196 lb (88.905 kg)  BMI 25.86 kg/m2  SpO2 98% Physical Exam  Nursing note and vitals reviewed. Constitutional: He is oriented to person, place, and time. He appears well-developed and well-nourished. No distress.  HENT:  Head: Normocephalic and atraumatic.  Right Ear: Tympanic membrane, external ear and ear canal normal.  Left Ear: Tympanic membrane, external ear and ear canal normal.  Nose: Mucosal edema and rhinorrhea present. Right sinus exhibits maxillary sinus tenderness. Left sinus exhibits maxillary sinus tenderness.  Mouth/Throat: No oral lesions. No oropharyngeal exudate.  Posterior pharynx red without tonsillar enlargement or exudate  Eyes: Right eye exhibits no discharge. Left eye exhibits no discharge. No scleral icterus.  Neck: Neck supple.  Cardiovascular: Normal rate, regular rhythm and normal heart sounds.   Pulmonary/Chest: Effort normal. No respiratory distress. He has no wheezes. He has rhonchi. He has no rales.  Lymphadenopathy:    He has no cervical adenopathy.  Neurological: He is alert and oriented to person, place, and time.  Skin: Skin is warm and dry.    ED Course  Procedures (including critical care time) Labs Review Labs Reviewed  POCT RAPID STREP A (OFFICE)   Results for orders placed during the hospital encounter of 01/02/14  POCT RAPID STREP A (OFFICE)      Result  Value Ref Range   Rapid Strep A Screen Negative  Negative     Imaging Review No results found.   MDM   1. Acute pharyngitis   2. Acute bronchitis    Rapid strep test negative.  Treatment options discussed, as well as risks, benefits, alternatives. Patient voiced understanding and agreement with the following plans: Zithromax Z-PAK Other OTC symptomatic care discussed He declined any prescription cough  medication . Follow-up with your primary care doctor in 5-7 days if not improving, or sooner if symptoms become worse. Precautions discussed. Red flags discussed. Questions invited and answered. Patient voiced understanding and agreement.     Lajean Manesavid Massey, MD 01/02/14 1330

## 2014-01-18 ENCOUNTER — Encounter: Payer: Self-pay | Admitting: Sports Medicine

## 2014-01-18 ENCOUNTER — Ambulatory Visit (INDEPENDENT_AMBULATORY_CARE_PROVIDER_SITE_OTHER): Payer: Worker's Compensation | Admitting: Sports Medicine

## 2014-01-18 VITALS — BP 117/79 | HR 71 | Ht 73.0 in | Wt 200.0 lb

## 2014-01-18 DIAGNOSIS — M79604 Pain in right leg: Secondary | ICD-10-CM | POA: Insufficient documentation

## 2014-01-18 DIAGNOSIS — S022XXA Fracture of nasal bones, initial encounter for closed fracture: Secondary | ICD-10-CM

## 2014-01-18 DIAGNOSIS — S060XAA Concussion with loss of consciousness status unknown, initial encounter: Secondary | ICD-10-CM

## 2014-01-18 DIAGNOSIS — M79609 Pain in unspecified limb: Secondary | ICD-10-CM

## 2014-01-18 DIAGNOSIS — S060X9A Concussion with loss of consciousness of unspecified duration, initial encounter: Secondary | ICD-10-CM

## 2014-01-18 NOTE — Assessment & Plan Note (Signed)
Asymptomatic now with full duty at work. Clinically resolved.

## 2014-01-18 NOTE — Assessment & Plan Note (Signed)
Pain-free, cosmetically appears normal. Return as needed.

## 2014-01-18 NOTE — Assessment & Plan Note (Signed)
Occurs typically after a long day of work, localized over the tibialis anterior and likely represents a tendinitis. Strapped with compressive dressing. Return if no better in a week or 2.

## 2014-01-18 NOTE — Progress Notes (Signed)
  Subjective:    CC: Followup  HPI: Concussion: Resolved.  Nasal fracture: Resolved.  Right leg pain: Over the anterolateral aspect of the right lower leg, mild, only occurs when working for long periods of time. No radiation. Described as burning.  Past medical history, Surgical history, Family history not pertinant except as noted below, Social history, Allergies, and medications have been entered into the medical record, reviewed, and no changes needed.   Review of Systems: No fevers, chills, night sweats, weight loss, chest pain, or shortness of breath.   Objective:    General: Well Developed, well nourished, and in no acute distress.  Neuro: Alert and oriented x3, extra-ocular muscles intact, sensation grossly intact.  HEENT: Normocephalic, atraumatic, pupils equal round reactive to light, neck supple, no masses, no lymphadenopathy, thyroid nonpalpable.  Skin: Warm and dry, no rashes. Cardiac: Regular rate and rhythm, no murmurs rubs or gallops, no lower extremity edema.  Respiratory: Clear to auscultation bilaterally. Not using accessory muscles, speaking in full sentences.  Impression and Recommendations:

## 2014-01-23 ENCOUNTER — Encounter: Payer: Self-pay | Admitting: *Deleted

## 2014-06-11 ENCOUNTER — Other Ambulatory Visit: Payer: Self-pay | Admitting: Sports Medicine

## 2014-06-11 ENCOUNTER — Ambulatory Visit (INDEPENDENT_AMBULATORY_CARE_PROVIDER_SITE_OTHER): Payer: BC Managed Care – PPO

## 2014-06-11 ENCOUNTER — Encounter: Payer: Self-pay | Admitting: Sports Medicine

## 2014-06-11 ENCOUNTER — Ambulatory Visit (INDEPENDENT_AMBULATORY_CARE_PROVIDER_SITE_OTHER): Payer: BC Managed Care – PPO | Admitting: Sports Medicine

## 2014-06-11 VITALS — BP 130/69 | HR 79 | Ht 73.0 in | Wt 183.0 lb

## 2014-06-11 DIAGNOSIS — M25531 Pain in right wrist: Secondary | ICD-10-CM

## 2014-06-11 MED ORDER — PREDNISONE (PAK) 10 MG PO TABS: ORAL_TABLET | ORAL | Status: DC

## 2014-06-11 NOTE — Assessment & Plan Note (Addendum)
This represents a fourth extensor compartment tendinitis. Velcro wrist brace, prednisone taper, return in one week if no better and we can do a fourth extensor compartment injection. If he is better just return in one month.  Because he has had pain for one year, we would likely proceed with a rheumatoid workup and probable advanced imaging if no better in one month.

## 2014-06-11 NOTE — Progress Notes (Signed)
  Subjective:    CC: Wrist pain  HPI: Carlos Harvey is a very pleasant 23 year old male, for the past several days he has noted increasing pain and stiffness on the dorsum of his right wrist, worse with essentially any movement, no swelling, no trauma. No change in activity level.  Past medical history, Surgical history, Family history not pertinant except as noted below, Social history, Allergies, and medications have been entered into the medical record, reviewed, and no changes needed.   Review of Systems: No fevers, chills, night sweats, weight loss, chest pain, or shortness of breath.   Objective:    General: Well Developed, well nourished, and in no acute distress.  Neuro: Alert and oriented x3, extra-ocular muscles intact, sensation grossly intact.  HEENT: Normocephalic, atraumatic, pupils equal round reactive to light, neck supple, no masses, no lymphadenopathy, thyroid nonpalpable.  Skin: Warm and dry, no rashes. Cardiac: Regular rate and rhythm, no murmurs rubs or gallops, no lower extremity edema.  Respiratory: Clear to auscultation bilaterally. Not using accessory muscles, speaking in full sentences. Right Wrist: Inspection normal with no visible erythema or swelling. Range of motion is limited by pain, there is tenderness to palpation over the fourth extensor compartment. There is no reproduction of pain with resisted flexion or extension of the wrist however resisted extension of fingers 2 through 4 reproduce concordant pain. No snuffbox tenderness. No tenderness over Canal of [place]. Strength 5/5 in all directions without pain. Negative Finkelstein, tinel's and phalens. Negative Watson's test.  X-rays are negative.  Impression and Recommendations:

## 2014-07-09 ENCOUNTER — Ambulatory Visit (INDEPENDENT_AMBULATORY_CARE_PROVIDER_SITE_OTHER): Payer: BC Managed Care – PPO | Admitting: Sports Medicine

## 2014-07-09 ENCOUNTER — Encounter: Payer: Self-pay | Admitting: Sports Medicine

## 2014-07-09 VITALS — BP 122/66 | HR 72 | Wt 173.0 lb

## 2014-07-09 DIAGNOSIS — M25531 Pain in right wrist: Secondary | ICD-10-CM | POA: Diagnosis not present

## 2014-07-09 NOTE — Assessment & Plan Note (Signed)
Pain is referable to both the fourth extensor compartment and the wrist joint itself. Injections placed into the fourth extensor compartment, and radiocarpal joint. Return in one month. If persistent pain we will do a rheumatoid workup and MRI.

## 2014-07-09 NOTE — Progress Notes (Signed)
  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  Unfortunately he has failed anti-inflammatories, and immobilization. Pain is localized over the fourth extensor compartment as well.  Past medical history, Surgical history, Family history not pertinant except as noted below, Social history, Allergies, and medications have been entered into the medical record, reviewed, and no changes needed.   Review of Systems: No fevers, chills, night sweats, weight loss, chest pain, or shortness of breath.   Objective:    General: Well Developed, well nourished, and in no acute distress.  Neuro: Alert and oriented x3, extra-ocular muscles intact, sensation grossly intact.  HEENT: Normocephalic, atraumatic, pupils equal round reactive to light, neck supple, no masses, no lymphadenopathy, thyroid nonpalpable.  Skin: Warm and dry, no rashes. Cardiac: Regular rate and rhythm, no murmurs rubs or gallops, no lower extremity edema.  Respiratory: Clear to auscultation bilaterally. Not using accessory muscles, speaking in full sentences.  Procedure: Real-time Ultrasound Guided Injection of left fourth extensor compartment and left radiocarpal joint Device: GE Logiq E  Verbal informed consent obtained.  Time-out conducted.  Noted no overlying erythema, induration, or other signs of local infection.  Skin prepped in a sterile fashion.  Local anesthesia: Topical Ethyl chloride.  With sterile technique and under real time ultrasound guidance:  A total of 0.75 mL kenalog 40, 2 mL lidocaine injected easily into the radiocarpal joint, the needle was then redirected and 0.75 mL of Kenalog 40 and 2 mL of lidocaine was then injected into the overlying fourth extensor compartment, there was a significant effusion in the radiocarpal joint as  well as visible tenosynovitis in the fourth extensor compartment. Completed without difficulty  Pain immediately resolved suggesting accurate placement of the medication.  Advised to call if fevers/chills, erythema, induration, drainage, or persistent bleeding.  Images permanently stored and available for review in the ultrasound unit.  Impression: Technically successful ultrasound guided injection.  Impression and Recommendations:

## 2014-08-20 ENCOUNTER — Encounter: Payer: Self-pay | Admitting: Emergency Medicine

## 2014-08-20 ENCOUNTER — Emergency Department
Admission: EM | Admit: 2014-08-20 | Discharge: 2014-08-20 | Disposition: A | Discharge status: Home / Self Care | Payer: BLUE CROSS/BLUE SHIELD | Source: Home / Self Care | Attending: Emergency Medicine | Admitting: Emergency Medicine

## 2014-08-20 DIAGNOSIS — J209 Acute bronchitis, unspecified: Secondary | ICD-10-CM | POA: Diagnosis not present

## 2014-08-20 DIAGNOSIS — J40 Bronchitis, not specified as acute or chronic: Secondary | ICD-10-CM

## 2014-08-20 MED ORDER — AZITHROMYCIN 250 MG PO TABS: 250.0000 mg | ORAL_TABLET | Freq: Every day | ORAL | Status: DC

## 2014-08-20 MED ORDER — BENZONATATE 100 MG PO CAPS: 100.0000 mg | ORAL_CAPSULE | Freq: Three times a day (TID) | ORAL | Status: DC

## 2014-08-20 NOTE — ED Provider Notes (Signed)
CSN: [id number]     Arrival date & time 08/20/14  1559 History   First MD Initiated Contact with Patient 08/20/14 1610     Chief Complaint  Patient presents with  . Cough  . Wheezing  . Fatigue  . Generalized Body Aches  . Sore Throat   (Consider location/radiation/quality/duration/timing/severity/associated sxs/prior Treatment) Patient is a 24 y.o. male presenting with cough, wheezing, and pharyngitis. The history is provided by the patient. No language interpreter was used.  Cough Cough characteristics:  Non-productive Severity:  Moderate Onset quality:  Gradual Duration:  5 days Timing:  Constant Progression:  Worsening Chronicity:  New Smoker: no   Relieved by:  Nothing Worsened by:  Nothing tried Ineffective treatments:  None tried Associated symptoms: wheezing   Associated symptoms: no shortness of breath   Wheezing Associated symptoms: cough   Associated symptoms: no shortness of breath   Sore Throat Pertinent negatives include no shortness of breath.    Past Medical History  Diagnosis Date  . Inguinal hernia     left   Past Surgical History  Procedure Laterality Date  . Lipoma excision  2007    buttock  . Inguinal hernia repair  03/09/11    left   History reviewed. No pertinent family history. History  Substance Use Topics  . Smoking status: Never Smoker   . Smokeless tobacco: Not on file  . Alcohol Use: No    Review of Systems  Respiratory: Positive for cough and wheezing. Negative for shortness of breath.   All other systems reviewed and are negative.   Allergies  Review of patient's allergies indicates no known allergies.  Home Medications   Prior to Admission medications   Medication Sig Start Date End Date Taking? Authorizing Provider  azithromycin (ZITHROMAX) 250 MG tablet Take 1 tablet (250 mg total) by mouth daily. Take first 2 tablets together, then 1 every day until finished. 08/20/14   Elson Areas, PA-C  benzonatate (TESSALON) 100  MG capsule Take 1 capsule (100 mg total) by mouth every 8 (eight) hours. 08/20/14   Elson Areas, PA-C   BP 119/75 mmHg  Pulse 69  Temp(Src) 97.9 F (36.6 C) (Oral)  Resp 16  Ht  (1.854 m)  Wt 170 lb (77.111 kg)  BMI 22.43 kg/m2  SpO2 99% Physical Exam  Constitutional: He is oriented to person, place, and time. He appears well-developed and well-nourished.  HENT:  Head: Normocephalic.  Eyes: Conjunctivae and EOM are normal. Pupils are equal, round, and reactive to light.  Neck: Normal range of motion.  Cardiovascular: Normal rate and normal heart sounds.   Pulmonary/Chest: Effort normal and breath sounds normal.  Abdominal: Soft. He exhibits no distension.  Musculoskeletal: Normal range of motion.  Neurological: He is alert and oriented to person, place, and time.  Psychiatric: He has a normal mood and affect.  Nursing note and vitals reviewed.   ED Course  Procedures (including critical care time) Labs Review Labs Reviewed - No data to display  Imaging Review No results found.   MDM   1. Bronchitis    Rx for tessalon Rx for zithromax Follow up with Dr. Karie Schwalbe in one week if symptoms persist AVS    Elson Areas, PA-C 08/20/14 1744

## 2014-08-20 NOTE — ED Notes (Signed)
Reports 5 days of congestion, cough, aches, fatigue, chills/sweats without taking temp; taking alka-seltzer cough/cold.

## 2014-08-20 NOTE — Discharge Instructions (Signed)

## 2014-09-22 ENCOUNTER — Emergency Department
Admission: EM | Admit: 2014-09-22 | Discharge: 2014-09-22 | Disposition: A | Discharge status: Home / Self Care | Payer: BLUE CROSS/BLUE SHIELD | Source: Home / Self Care | Attending: Family Medicine | Admitting: Family Medicine

## 2014-09-22 DIAGNOSIS — R1110 Vomiting, unspecified: Secondary | ICD-10-CM

## 2014-09-22 DIAGNOSIS — F10120 Alcohol abuse with intoxication, uncomplicated: Secondary | ICD-10-CM

## 2014-09-22 MED ORDER — ONDANSETRON HCL 4 MG/2ML IJ SOLN
4.0000 mg | Freq: Once | INTRAMUSCULAR | Status: AC
  Administered 2014-09-22: 4 mg via INTRAMUSCULAR

## 2014-09-22 MED ORDER — PROMETHAZINE HCL 25 MG PO TABS: 25.0000 mg | ORAL_TABLET | Freq: Four times a day (QID) | ORAL | Status: DC | PRN

## 2014-09-22 NOTE — ED Notes (Signed)
Patient c/o nausea / vomiting. Patient states that he drank to much last night and now he cannot keep anything down.

## 2014-09-22 NOTE — Discharge Instructions (Signed)
Thank you for coming in today. Take Phenergan every 6 hours as needed for vomiting. Drink a little bit of fluid frequently. Return as needed. If your belly pain worsens, or you have high fever, bad vomiting, blood in your stool or black tarry stool go to the Emergency Room.   Nausea and Vomiting Nausea is a sick feeling that often comes before throwing up (vomiting). Vomiting is a reflex where stomach contents come out of your mouth. Vomiting can cause severe loss of body fluids (dehydration). Children and elderly adults can become dehydrated quickly, especially if they also have diarrhea. Nausea and vomiting are symptoms of a condition or disease. It is important to find the cause of your symptoms. CAUSES   Direct irritation of the stomach lining. This irritation can result from increased acid production (gastroesophageal reflux disease), infection, food poisoning, taking certain medicines (such as nonsteroidal anti-inflammatory drugs), alcohol use, or tobacco use.  Signals from the brain.These signals could be caused by a headache, heat exposure, an inner ear disturbance, increased pressure in the brain from injury, infection, a tumor, or a concussion, pain, emotional stimulus, or metabolic problems.  An obstruction in the gastrointestinal tract (bowel obstruction).  Illnesses such as diabetes, hepatitis, gallbladder problems, appendicitis, kidney problems, cancer, sepsis, atypical symptoms of a heart attack, or eating disorders.  Medical treatments such as chemotherapy and radiation.  Receiving medicine that makes you sleep (general anesthetic) during surgery. DIAGNOSIS Your caregiver may ask for tests to be done if the problems do not improve after a few days. Tests may also be done if symptoms are severe or if the reason for the nausea and vomiting is not clear. Tests may include:  Urine tests.  Blood tests.  Stool tests.  Cultures (to look for evidence of infection).  X-rays or  other imaging studies. Test results can help your caregiver make decisions about treatment or the need for additional tests. TREATMENT You need to stay well hydrated. Drink frequently but in small amounts.You may wish to drink water, sports drinks, clear broth, or eat frozen ice pops or gelatin dessert to help stay hydrated.When you eat, eating slowly may help prevent nausea.There are also some antinausea medicines that may help prevent nausea. HOME CARE INSTRUCTIONS   Take all medicine as directed by your caregiver.  If you do not have an appetite, do not force yourself to eat. However, you must continue to drink fluids.  If you have an appetite, eat a normal diet unless your caregiver tells you differently.  Eat a variety of complex carbohydrates (rice, wheat, potatoes, bread), lean meats, yogurt, fruits, and vegetables.  Avoid high-fat foods because they are more difficult to digest.  Drink enough water and fluids to keep your urine clear or pale yellow.  If you are dehydrated, ask your caregiver for specific rehydration instructions. Signs of dehydration may include:  Severe thirst.  Dry lips and mouth.  Dizziness.  Dark urine.  Decreasing urine frequency and amount.  Confusion.  Rapid breathing or pulse. SEEK IMMEDIATE MEDICAL CARE IF:   You have blood or brown flecks (like coffee grounds) in your vomit.  You have black or bloody stools.  You have a severe headache or stiff neck.  You are confused.  You have severe abdominal pain.  You have chest pain or trouble breathing.  You do not urinate at least once every 8 hours.  You develop cold or clammy skin.  You continue to vomit for longer than 24 to 48 hours.  You have a fever. MAKE SURE YOU:   Understand these instructions.  Will watch your condition.  Will get help right away if you are not doing well or get worse. Document Released: 08/03/2005 Document Revised: 10/26/2011 Document Reviewed:  12/31/2010 [facility] Patient Information 2015 [place], [place]. This information is not intended to replace advice given to you by your health care provider. Make sure you discuss any questions you have with your health care provider.

## 2014-09-22 NOTE — ED Provider Notes (Signed)
Carlos Harvey is a 24 y.o. male who presents to Urgent Care today for vomiting. Patient developed vomiting starting today after he drank too much alcohol last night. No chest pain shortness of breath abdominal pain and diarrhea. He is having trouble keeping any fluids down. He is still urinating. He feels well otherwise.   Past Medical History  Diagnosis Date  . Inguinal hernia     left   Past Surgical History  Procedure Laterality Date  . Lipoma excision  2007    buttock  . Inguinal hernia repair  03/09/11    left   History  Substance Use Topics  . Smoking status: Never Smoker   . Smokeless tobacco: Not on file  . Alcohol Use: No   ROS as above Medications: No current facility-administered medications for this encounter.   Current Outpatient Prescriptions  Medication Sig Dispense Refill  . promethazine (PHENERGAN) 25 MG tablet Take 1 tablet (25 mg total) by mouth every 6 (six) hours as needed for nausea or vomiting. 20 tablet 0   No Known Allergies   Exam:  BP 125/82 mmHg  Pulse 87  Temp(Src) 97.9 F (36.6 C) (Oral)  Ht 6' (1.829 m)  Wt 170 lb (77.111 kg)  BMI 23.05 kg/m2  SpO2 100% Gen: Well NAD HEENT: EOMI,  MMM Lungs: Normal work of breathing. CTABL normal posterior pharynx Heart: RRR no MRG Abd: NABS, Soft. Nondistended, Nontender Exts: Brisk capillary refill, warm and well perfused.   Patient was given 4 mg IM Zofran prior to discharge.  No results found for this or any previous visit (from the past 24 hour(s)). No results found.  Assessment and Plan: 24 y.o. male with vomiting due to hangover. Vital signs normal. Patient is well appearing. Treat with Zofran and as above and oral Phenergan at home. Return as needed.  Discussed warning signs or symptoms. Please see discharge instructions. Patient expresses understanding.     Rodolph BongEvan S Krysta Bloomfield, MD 09/22/14 [phone]

## 2014-11-19 ENCOUNTER — Encounter: Payer: Self-pay | Admitting: *Deleted

## 2014-11-19 ENCOUNTER — Emergency Department
Admission: EM | Admit: 2014-11-19 | Discharge: 2014-11-19 | Disposition: A | Discharge status: Home / Self Care | Payer: BLUE CROSS/BLUE SHIELD | Source: Home / Self Care | Attending: Family Medicine | Admitting: Family Medicine

## 2014-11-19 DIAGNOSIS — L989 Disorder of the skin and subcutaneous tissue, unspecified: Secondary | ICD-10-CM

## 2014-11-19 MED ORDER — SALICYLIC ACID 17 % EX SOLN: CUTANEOUS | Status: DC

## 2014-11-19 NOTE — Discharge Instructions (Signed)
Decrease frequency of application if surrounding skin irritation occurs.

## 2014-11-19 NOTE — ED Provider Notes (Signed)
CSN: [id number]     Arrival date & time 11/19/14  1522 History   First MD Initiated Contact with Patient 11/19/14 1621     Chief Complaint  Patient presents with  . Rash      HPI Comments: Patient complains of 2 year history of an occasionally recurring well localized "rash" on the tip of his left 4th finger.  In the past it has appeared for one to two months and partially cleared.  The lesion has now been present for about one month.  He describes the lesion as having a rough surface with tiny "holes."  No pain, swelling, warmth, drainage.  The lesion has not increased in size.  Patient is a 24 y.o. male presenting with rash. The history is provided by the patient.  Rash Location: left 4th finger. Quality: dryness   Quality: not blistering, not burning, not draining, not itchy, not painful, not peeling, not red, not scaling, not swelling and not weeping   Severity:  Mild Onset quality:  Gradual Duration:  1 month Timing:  Constant Progression:  Unchanged Chronicity:  Recurrent Context: not animal contact, not chemical exposure, not exposure to similar rash, not insect bite/sting, not medications, not new detergent/soap and not plant contact   Relieved by:  Nothing Worsened by:  Nothing tried Ineffective treatments:  None tried Associated symptoms comment:  None   Past Medical History  Diagnosis Date  . Inguinal hernia     left   Past Surgical History  Procedure Laterality Date  . Lipoma excision  2007    buttock  . Inguinal hernia repair  03/09/11    left   History reviewed. No pertinent family history. History  Substance Use Topics  . Smoking status: Never Smoker   . Smokeless tobacco: Not on file  . Alcohol Use: No    Review of Systems  Skin: Positive for rash.  All other systems reviewed and are negative.   Allergies  Review of patient's allergies indicates no known allergies.  Home Medications   Prior to Admission medications   Medication Sig Start Date  End Date Taking? Authorizing Provider  salicylic acid-lactic acid 17 % external solution Apply small amount to lesion at bedtime for up to two months. 11/19/14   Lattie HawStephen A Mekel Haverstock, MD   BP 134/77 mmHg  Pulse 71  Temp(Src) 98.3 F (36.8 C)  Resp 14  SpO2 100% Physical Exam  Constitutional: He is oriented to person, place, and time. He appears well-developed and well-nourished. He appears distressed.  HENT:  Head: Normocephalic.  Mouth/Throat: Oropharynx is clear and moist.  Eyes: Pupils are equal, round, and reactive to light.  Musculoskeletal:       Hands: Volar pad of left 4th fingertip has an oval patch about 6mm by 8mm with well defined border and scattered tiny black puncta on its surface.  The lesion is flesh colored and not raised.  No tenderness to palpation.  Neurological: He is alert and oriented to person, place, and time.  Skin: Skin is warm and dry.  Nursing note and vitals reviewed.   ED Course  Procedures  none   MDM   1. Lesion of finger; ?atypical verruca vulgrais    Begin application of salicylic acid solution at bedtime. Decrease frequency of application if surrounding skin irritation occurs. Followup with dermatologist if not improved 8 weeks.    Lattie HawStephen A Raylyn Speckman, MD 11/21/14 [phone]

## 2014-11-19 NOTE — ED Notes (Signed)
Carlos BroodBrent c/o skin disruption to tip of left 4th finger x 1 month. He describes it has small holes in his finger that he now believes is worsening. Denies drainage. C/o mild discomofort. This has happened in the past but resolved on its own.

## 2015-05-27 ENCOUNTER — Encounter: Payer: Self-pay | Admitting: *Deleted

## 2015-05-27 ENCOUNTER — Emergency Department (INDEPENDENT_AMBULATORY_CARE_PROVIDER_SITE_OTHER): Payer: BLUE CROSS/BLUE SHIELD

## 2015-05-27 ENCOUNTER — Emergency Department (INDEPENDENT_AMBULATORY_CARE_PROVIDER_SITE_OTHER)
Admission: EM | Admit: 2015-05-27 | Discharge: 2015-05-27 | Disposition: A | Discharge status: Home / Self Care | Payer: Worker's Compensation | Source: Home / Self Care | Attending: Family Medicine | Admitting: Family Medicine

## 2015-05-27 DIAGNOSIS — S39012A Strain of muscle, fascia and tendon of lower back, initial encounter: Secondary | ICD-10-CM

## 2015-05-27 DIAGNOSIS — S300XXA Contusion of lower back and pelvis, initial encounter: Secondary | ICD-10-CM

## 2015-05-27 DIAGNOSIS — M545 Low back pain: Secondary | ICD-10-CM

## 2015-05-27 MED ORDER — CYCLOBENZAPRINE HCL 10 MG PO TABS: ORAL_TABLET | ORAL | Status: DC

## 2015-05-27 MED ORDER — MELOXICAM 15 MG PO TABS: 15.0000 mg | ORAL_TABLET | Freq: Every day | ORAL | Status: DC

## 2015-05-27 MED ORDER — IBUPROFEN 800 MG PO TABS
800.0000 mg | ORAL_TABLET | Freq: Once | ORAL | Status: AC
  Administered 2015-05-27: 800 mg via ORAL

## 2015-05-27 NOTE — ED Notes (Signed)
Workers comp injury: Alon reports that he slipped and fell in a trailer on 05/24/15, and landed on his back. Today at 1600 he was cleaning up a trailer and his back "locked up " on him for 30 minutes. He c/o low back and hip pain going up his spine. No OTC meds.

## 2015-05-27 NOTE — ED Provider Notes (Signed)
CSN: [id number]     Arrival date & time 05/27/15  1821 History   First MD Initiated Contact with Patient 05/27/15 1848     Chief Complaint  Patient presents with  . Back Pain      HPI Comments: Two days ago while working in his trailer, patient slipped on wet floor and fell backwards.  His lower back struck the edge of a pallet.  He has had persistent pain/stiffness in his lower back that has improved over the past two days.  Today, however, while cleaning his trailer his back "locked up" and he has had increased pain/stiffness. The pain radiates somewhat to the right hip area.   He denies bowel or bladder dysfunction, and no saddle numbness.    Patient is a 24 y.o. male presenting with back pain. The history is provided by the patient.  Back Pain Location:  Lumbar spine and sacro-iliac joint Quality:  Aching and stiffness Stiffness is present:  All day Radiates to: right hip. Pain severity:  Moderate Pain is:  Same all the time Onset quality:  Gradual Duration:  2 days Timing:  Constant Progression:  Worsening Chronicity:  New Context: falling and occupational injury   Relieved by:  Nothing Worsened by:  Twisting, bending and ambulation Ineffective treatments:  Heating pad Associated symptoms: no abdominal pain, no abdominal swelling, no bladder incontinence, no bowel incontinence, no chest pain, no fever, no leg pain, no numbness, no paresthesias, no pelvic pain, no perianal numbness, no tingling and no weakness     Past Medical History  Diagnosis Date  . Inguinal hernia     left   Past Surgical History  Procedure Laterality Date  . Lipoma excision  2007    buttock  . Inguinal hernia repair  03/09/11    left   History reviewed. No pertinent family history. Social History  Substance Use Topics  . Smoking status: Never Smoker   . Smokeless tobacco: None  . Alcohol Use: No    Review of Systems  Constitutional: Negative for fever.  Cardiovascular: Negative for chest  pain.  Gastrointestinal: Negative for abdominal pain and bowel incontinence.  Genitourinary: Negative for bladder incontinence and pelvic pain.  Musculoskeletal: Positive for back pain.  Neurological: Negative for tingling, weakness, numbness and paresthesias.  All other systems reviewed and are negative.   Allergies  Review of patient's allergies indicates no known allergies.  Home Medications   Prior to Admission medications   Medication Sig Start Date End Date Taking? Authorizing Provider  cyclobenzaprine (FLEXERIL) 10 MG tablet Take one tab by mouth at bedtime for muscle spasm 05/27/15   Lattie Haw, MD  meloxicam (MOBIC) 15 MG tablet Take 1 tablet (15 mg total) by mouth daily. Take with food each morning 05/27/15   Lattie Haw, MD  salicylic acid-lactic acid 17 % external solution Apply small amount to lesion at bedtime for up to two months. 11/19/14   Lattie Haw, MD   Meds Ordered and Administered this Visit  Medications - No data to display  BP 128/86 mmHg  Pulse 87  Temp(Src) 98.3 F (36.8 C) (Oral)  Resp 16  Ht  (1.854 m)  Wt 165 lb (74.844 kg)  BMI 21.77 kg/m2  SpO2 100% No data found.   Physical Exam  Constitutional: He is oriented to person, place, and time. He appears well-developed and well-nourished. No distress.  HENT:  Head: Atraumatic.  Right Ear: External ear normal.  Left Ear: External ear normal.  Nose: Nose normal.  Mouth/Throat: Oropharynx is clear and moist.  Eyes: Conjunctivae and EOM are normal. Pupils are equal, round, and reactive to light.  Neck: Normal range of motion. Neck supple.  Cardiovascular: Normal heart sounds.   Pulmonary/Chest: Breath sounds normal.  Abdominal: There is no tenderness.  Musculoskeletal:       Lumbar back: He exhibits decreased range of motion, tenderness, bony tenderness and pain. He exhibits no swelling, no edema, no deformity, no laceration and no spasm.       Back:  Back:  Can heel/toe walk  and squat with difficulty.  Decreased forward flexion.  Tenderness in the midline from approximately L2 to sacrum, and tenderness in the paraspinous muscles from L3 to Sacral area, worse on the right. Straight leg raising test is negative.  Sitting knee extension test is negative.  Strength and sensation in the lower extremities is normal.  Patellar and achilles reflexes are normal.   Neurological: He is alert and oriented to person, place, and time.  Skin: Skin is warm and dry.  Nursing note and vitals reviewed.   ED Course  Procedures None    Imaging Review Dg Lumbar Spine Complete  05/27/2015   CLINICAL DATA:  24 year old male lumbar back pain from L2 to the sacrum after fall backwards on to palate at work 2 days ago. Symptoms greater on the right side. Initial encounter.  EXAM: LUMBAR SPINE - COMPLETE 4+ VIEW  COMPARISON:  None.  FINDINGS: Sequelae of ventral abdominal hernia repair with small metallic fasteners. Normal lumbar segmentation. Preserved vertebral height and alignment. Relatively preserved disc spaces. There is mild lumbar endplate degenerative spurring. No pars fracture. sacral ala and SI joints within normal limits. Visible lower thoracic levels appear intact.  IMPRESSION: No acute osseous abnormality identified in the lumbar spine.   Electronically Signed   By: Odessa Fleming M.D.   On: 05/27/2015 19:18      MDM   1. Contusion of lower back, initial encounter   2. Low back strain, initial encounter    Admistered ibuprofen  PO Begin Mobic  QAM, and Flexeril  HS Apply ice pack for 20 to 30 minutes, 3 to 4 times daily  Continue until pain decreases.  Avoid lifting, pushing, pulling.  Begin back exercises as tolerated Followup with [facility] in one week.  Work restrictions:  Light duty for one week.     Lattie Haw, MD 05/27/15 [phone]

## 2015-05-27 NOTE — Discharge Instructions (Signed)
Apply ice pack for 20 to 30 minutes, 3 to 4 times daily  Continue until pain decreases.  Avoid lifting, pushing, pulling.  Begin back exercises as tolerated   Contusion A contusion is a deep bruise. Contusions are the result of a blunt injury to tissues and muscle fibers under the skin. The injury causes bleeding under the skin. The skin overlying the contusion may turn blue, purple, or yellow. Minor injuries will give you a painless contusion, but more severe contusions may stay painful and swollen for a few weeks.  CAUSES  This condition is usually caused by a blow, trauma, or direct force to an area of the body. SYMPTOMS  Symptoms of this condition include:  Swelling of the injured area.  Pain and tenderness in the injured area.  Discoloration. The area may have redness and then turn blue, purple, or yellow. DIAGNOSIS  This condition is diagnosed based on a physical exam and medical history. An X-ray, CT scan, or MRI may be needed to determine if there are any associated injuries, such as broken bones (fractures). TREATMENT  Specific treatment for this condition depends on what area of the body was injured. In general, the best treatment for a contusion is resting, icing, applying pressure to (compression), and elevating the injured area. This is often called the RICE strategy. Over-the-counter anti-inflammatory medicines may also be recommended for pain control.  HOME CARE INSTRUCTIONS   Rest the injured area.  If directed, apply ice to the injured area:  Put ice in a plastic bag.  Place a towel between your skin and the bag.  Leave the ice on for 20 minutes, 2-3 times per day.  If directed, apply light compression to the injured area using an elastic bandage. Make sure the bandage is not wrapped too tightly. Remove and reapply the bandage as directed by your health care provider.  If possible, raise (elevate) the injured area above the level of your heart while you are sitting  or lying down.  Take over-the-counter and prescription medicines only as told by your health care provider. SEEK MEDICAL CARE IF:  Your symptoms do not improve after several days of treatment.  Your symptoms get worse.  You have difficulty moving the injured area. SEEK IMMEDIATE MEDICAL CARE IF:   You have severe pain.  You have numbness in a hand or foot.  Your hand or foot turns pale or cold.   This information is not intended to replace advice given to you by your health care provider. Make sure you discuss any questions you have with your health care provider.   Document Released: 05/13/2005 Document Revised: 04/24/2015 Document Reviewed: 12/19/2014 Elsevier Interactive Patient Education 2016 [employer]    Lumbosacral Strain Lumbosacral strain is a strain of any of the parts that make up your lumbosacral vertebrae. Your lumbosacral vertebrae are the bones that make up the lower third of your backbone. Your lumbosacral vertebrae are held together by muscles and tough, fibrous tissue (ligaments).  CAUSES  A sudden blow to your back can cause lumbosacral strain. Also, anything that causes an excessive stretch of the muscles in the low back can cause this strain. This is typically seen when people exert themselves strenuously, fall, lift heavy objects, bend, or crouch repeatedly. RISK FACTORS  Physically demanding work.  Participation in pushing or pulling sports or sports that require a sudden twist of the back (tennis, golf, baseball).  Weight lifting.  Excessive lower back curvature.  Forward-tilted pelvis.  Weak back  or abdominal muscles or both.  Tight hamstrings. SIGNS AND SYMPTOMS  Lumbosacral strain may cause pain in the area of your injury or pain that moves (radiates) down your leg.  DIAGNOSIS Your health care provider can often diagnose lumbosacral strain through a physical exam. In some cases, you may need tests such as X-ray exams.  TREATMENT    Treatment for your lower back injury depends on many factors that your clinician will have to evaluate. However, most treatment will include the use of anti-inflammatory medicines. HOME CARE INSTRUCTIONS   Avoid hard physical activities (tennis, racquetball, waterskiing) if you are not in proper physical condition for it. This may aggravate or create problems.  If you have a back problem, avoid sports requiring sudden body movements. Swimming and walking are generally safer activities.  Maintain good posture.  Maintain a healthy weight.  For acute conditions, you may put ice on the injured area.  Put ice in a plastic bag.  Place a towel between your skin and the bag.  Leave the ice on for 20 minutes, 2-3 times a day.  When the low back starts healing, stretching and strengthening exercises may be recommended. SEEK MEDICAL CARE IF:  Your back pain is getting worse.  You experience severe back pain not relieved with medicines. SEEK IMMEDIATE MEDICAL CARE IF:   You have numbness, tingling, weakness, or problems with the use of your arms or legs.  There is a change in bowel or bladder control.  You have increasing pain in any area of the body, including your belly (abdomen).  You notice shortness of breath, dizziness, or feel faint.  You feel sick to your stomach (nauseous), are throwing up (vomiting), or become sweaty.  You notice discoloration of your toes or legs, or your feet get very cold. MAKE SURE YOU:   Understand these instructions.  Will watch your condition.  Will get help right away if you are not doing well or get worse.   This information is not intended to replace advice given to you by your health care provider. Make sure you discuss any questions you have with your health care provider.   Document Released: 05/13/2005 Document Revised: 08/24/2014 Document Reviewed: 03/22/2013 Elsevier Interactive Patient Education [employer]

## 2015-06-03 ENCOUNTER — Emergency Department
Admission: EM | Admit: 2015-06-03 | Discharge: 2015-06-03 | Disposition: A | Discharge status: Home / Self Care | Payer: BLUE CROSS/BLUE SHIELD | Source: Home / Self Care | Attending: Family Medicine | Admitting: Family Medicine

## 2015-06-03 ENCOUNTER — Encounter: Payer: Self-pay | Admitting: *Deleted

## 2015-06-03 DIAGNOSIS — J36 Peritonsillar abscess: Secondary | ICD-10-CM | POA: Diagnosis not present

## 2015-06-03 DIAGNOSIS — J029 Acute pharyngitis, unspecified: Secondary | ICD-10-CM

## 2015-06-03 MED ORDER — HYDROCODONE-ACETAMINOPHEN 7.5-325 MG/15ML PO SOLN: 15.0000 mL | Freq: Four times a day (QID) | ORAL | Status: DC | PRN

## 2015-06-03 MED ORDER — CEFTRIAXONE SODIUM 1 G IJ SOLR
1.0000 g | Freq: Once | INTRAMUSCULAR | Status: AC
  Administered 2015-06-03: 1 g via INTRAMUSCULAR

## 2015-06-03 MED ORDER — CLINDAMYCIN HCL 150 MG PO CAPS: 300.0000 mg | ORAL_CAPSULE | Freq: Three times a day (TID) | ORAL | Status: DC

## 2015-06-03 MED ORDER — PREDNISONE 20 MG PO TABS: ORAL_TABLET | ORAL | Status: DC

## 2015-06-03 MED ORDER — DEXAMETHASONE SODIUM PHOSPHATE 10 MG/ML IJ SOLN: 10.0000 mg | Freq: Once | INTRAMUSCULAR | Status: DC

## 2015-06-03 MED ORDER — DEXAMETHASONE SODIUM PHOSPHATE 10 MG/ML IJ SOLN
10.0000 mg | Freq: Once | INTRAMUSCULAR | Status: AC
  Administered 2015-06-03: 10 mg via INTRAMUSCULAR

## 2015-06-03 MED ORDER — IBUPROFEN 100 MG/5ML PO SUSP: 600.0000 mg | Freq: Four times a day (QID) | ORAL | Status: DC | PRN

## 2015-06-03 NOTE — Discharge Instructions (Signed)
You were given a shot of Rocephin, an antibiotic, and Decadron, a steroid to help with swelling.  Please start your Clindamycin, antibiotic, today.  You may start the oral prednisone tomorrow.  Please stay well hydrated with cool liquids, popsicles, or ice chips.  It is "okay" if you do not eat much, as long as you stay well hydrated.  You may want to stick with soft foods to help reduce pain including yogurt, apple sauce, puddings, Jell-o, and soups.  Hycet (hydrocodone-acetaminophen) is a narcotic pain medication, do not combine these medications with others containing tylenol. While taking, do not drink alcohol, drive, or perform any other activities that requires focus while taking these medications.   If you develop difficulty breathing, unable to keep down fluids, please call 911 or go to closest emergency department immediately.   Peritonsillar Abscess A peritonsillar abscess is a collection of yellowish-white fluid (pus) in the back of the throat behind the tonsils. It usually occurs when an infection of the throat or tonsils (tonsillitis) spreads into the tissues around the tonsils. CAUSES The infection that leads to a peritonsillar abscess is usually caused by streptococcal bacteria.  SIGNS AND SYMPTOMS  Sore throat, often with pain on just one side.  Swelling and tenderness of the glands (lymph nodes) in the neck.  Difficulty swallowing.  Difficulty opening your mouth.  Fever.  Chills.  Drooling because of difficulty swallowing saliva.  Headache.  Changes in your voice.  Bad breath. DIAGNOSIS Your health care provider will take your medical history and do a physical exam. Imaging tests may be done, such as an ultrasound or CT scan. A sample of pus may be removed from the abscess using a needle (needle aspiration) or by swabbing the back of your throat. This sample will be sent to a lab for testing. TREATMENT Treatment usually involves draining the pus from the abscess.  This may be done through needle aspiration or by making an incision in the abscess. You will also likely need to take antibiotic medicine. HOME CARE INSTRUCTIONS  Rest as much as possible and get plenty of sleep.  Take medicines only as directed by your health care provider.  If you were prescribed an antibiotic medicine, finish it all even if you start to feel better.  If your abscess was drained by your health care provider, gargle with a mixture of salt and warm water:  Mix 1 tsp of salt in 8 oz of warm water.  Gargle with this mixture four times per day or as needed for comfort.  Do not swallow this mixture.  Drink plenty of fluids.  While your throat is sore, eat soft or liquid foods, such as frozen ice pops and ice cream.  Keep all follow-up visits as directed by your health care provider. This is important. SEEK MEDICAL CARE IF:  You have increased pain, swelling, redness, or drainage in your throat.  You develop a headache, a lack of energy (lethargy), or generalized feelings of illness.  You have a fever.  You feel dizzy.  You have difficulty swallowing or eating.  You show signs of becoming dehydrated, such as:  Light-headedness when standing.  Decreased urine output.  A fast heart rate.  Dry mouth. SEEK IMMEDIATE MEDICAL CARE IF:   You have difficulty talking or breathing, or you find it easier to breathe when you lean forward.  You are coughing up blood or vomiting blood.  You have severe throat pain that is not helped by medicines.  You  start to drool.   This information is not intended to replace advice given to you by your health care provider. Make sure you discuss any questions you have with your health care provider.   Document Released: 08/03/2005 Document Revised: 08/24/2014 Document Reviewed: 03/19/2014 Elsevier Interactive Patient Education [employer]

## 2015-06-03 NOTE — ED Notes (Signed)
Pt c/o sore throat on left side x 4 days. Painful to talk and swallow. Abscess to left tonsil.

## 2015-06-03 NOTE — ED Notes (Signed)
F/u apt with Dr. Benjamin Stainhekkekandam scheduled with Magda PaganiniAudrey for 06/05/15 @ 930am. Apt info given to patient while in office.

## 2015-06-03 NOTE — ED Provider Notes (Signed)
CSN: [id number]     Arrival date & time 06/03/15  1041 History   First MD Initiated Contact with Patient 06/03/15 1044     Chief Complaint  Patient presents with  . Sore Throat   (Consider location/radiation/quality/duration/timing/severity/associated sxs/prior Treatment) HPI Pt is a 24yo male [facility] with c/o gradually worsening sore throat that started 4 days ago.  He has associated generalized headache, Left ear pain, fever up to 101*F.  Pt states talking and swallowing make pain worse.  Pain is moderate to severe. No known sick contacts or recent travel.  He is able to keep down fluids but states it hurts to swallow.  Denies n/v/d.   Past Medical History  Diagnosis Date  . Inguinal hernia     left   Past Surgical History  Procedure Laterality Date  . Lipoma excision  2007    buttock  . Inguinal hernia repair  03/09/11    left   History reviewed. No pertinent family history. Social History  Substance Use Topics  . Smoking status: Never Smoker   . Smokeless tobacco: None  . Alcohol Use: No    Review of Systems  Constitutional: Positive for fever ( 101) and fatigue. Negative for chills.  HENT: Positive for ear pain (left) and sore throat. Negative for congestion, trouble swallowing and voice change.   Respiratory: Negative for cough and shortness of breath.   Cardiovascular: Negative for chest pain and palpitations.  Gastrointestinal: Negative for nausea, vomiting, abdominal pain and diarrhea.  Musculoskeletal: Positive for myalgias and arthralgias. Negative for back pain.  Skin: Negative for rash.  Neurological: Positive for dizziness and headaches. Negative for light-headedness.  All other systems reviewed and are negative.   Allergies  Review of patient's allergies indicates no known allergies.  Home Medications   Prior to Admission medications   Medication Sig Start Date End Date Taking? Authorizing Provider  clindamycin (CLEOCIN) 150 MG capsule Take  2 capsules (300 mg total) by mouth 3 (three) times daily. For 10 days 06/03/15   Junius FinnerErin O'Malley, PA-C  cyclobenzaprine (FLEXERIL) 10 MG tablet Take one tab by mouth at bedtime for muscle spasm 05/27/15   Lattie HawStephen A Beese, MD  HYDROcodone-acetaminophen (HYCET) 7.5-325 mg/15 ml solution Take 15 mLs by mouth every 6 (six) hours as needed for moderate pain. 06/03/15 06/02/16  Junius FinnerErin O'Malley, PA-C  ibuprofen (CHILD IBUPROFEN) 100 MG/5ML suspension Take 30 mLs (600 mg total) by mouth every 6 (six) hours as needed for fever, mild pain or moderate pain. 06/03/15   Junius FinnerErin O'Malley, PA-C  meloxicam (MOBIC) 15 MG tablet Take 1 tablet (15 mg total) by mouth daily. Take with food each morning 05/27/15   Lattie HawStephen A Beese, MD  predniSONE (DELTASONE) 20 MG tablet 3 tabs po day one, then 2 po daily x 4 days 06/03/15   Junius FinnerErin O'Malley, PA-C  salicylic acid-lactic acid 17 % external solution Apply small amount to lesion at bedtime for up to two months. 11/19/14   Lattie HawStephen A Beese, MD   Meds Ordered and Administered this Visit   Medications  cefTRIAXone (ROCEPHIN) injection 1 g (1 g Intramuscular Given 06/03/15 1119)  dexamethasone (DECADRON) injection 10 mg (10 mg Intramuscular Given 06/03/15 1120)    BP 121/78 mmHg  Pulse 105  Temp(Src) 98.6 F (37 C) (Oral)  Resp 16  Wt 156 lb (70.761 kg)  SpO2 100% No data found.   Physical Exam  Constitutional: He appears well-developed and well-nourished.  HENT:  Head: Normocephalic and atraumatic.  Right Ear: Hearing, tympanic membrane, external ear and ear canal normal.  Left Ear: Hearing, tympanic membrane, external ear and ear canal normal.  Nose: Nose normal.  Mouth/Throat: Uvula is midline and mucous membranes are normal. No trismus in the jaw. Oropharyngeal exudate, posterior oropharyngeal edema, posterior oropharyngeal erythema and tonsillar abscesses present.    Uvula midline. No airway involvement. Tonsillar erythema, edema and exudate with moderate to severe  edema of Left tonsil compared to Right c/w tonsillar abscess  Eyes: Conjunctivae are normal. No scleral icterus.  Neck: Normal range of motion. Neck supple.  Speech is clear, no muffled voice or stridor.  Cardiovascular: Normal rate, regular rhythm and normal heart sounds.   Pulmonary/Chest: Effort normal and breath sounds normal. No stridor. No respiratory distress. He has no wheezes. He has no rales. He exhibits no tenderness.  Abdominal: Soft. Bowel sounds are normal. He exhibits no distension and no mass. There is no tenderness. There is no rebound and no guarding.  Musculoskeletal: Normal range of motion.  Lymphadenopathy:    He has cervical adenopathy.  Neurological: He is alert.  Skin: Skin is warm and dry.  Nursing note and vitals reviewed.   ED Course  Procedures (including critical care time)  Labs Review Labs Reviewed - No data to display  Imaging Review No results found.    MDM   1. Tonsillar abscess   2. Acute pharyngitis, unspecified etiology    Pt presenting to [facility] with c/o worsening sore throat with Left sided tonsillar edema and exudate c/w tonsillar abscess. Uvula is midline however and airway is maintained. He is able to drink water in exam room w/o difficulty.   No strep test performed as pt will be started empirically on antibiotics for tonsillar abscess. Tx in UC: Decadron  IM and Rocephin 1g IM Rx: Clindamycin  TID for 10 days, prednisone  day 1,  x 4 days, liquid ibuprofen and liquid Hycet. Patient counseled on use of narcotic pain medications. Counseled not to combine these medications with others containing tylenol. Urged not to drink alcohol, drive, or perform any other activities that requires focus while taking these medications.   Home care instructions provided.  Strongly encouraged good hydration with PO fluids including ice chips and popsicles, and soft food diet until pain improves.  Pt is safe for discharge home. F/u with Dr.  Benjamin Stain at 9:30AM on Wednesday 06/05/15 for recheck of symptoms. Discussed signs/symptoms that warrant call to 911 or visit to closest emergency department.  Pt provided work note to return on Wednesday after his appointment.  Patient verbalized understanding and agreement with treatment plan.      Junius Finner, PA-C 06/03/15 1150

## 2015-06-04 ENCOUNTER — Telehealth: Payer: Self-pay | Admitting: *Deleted

## 2015-06-05 ENCOUNTER — Ambulatory Visit (INDEPENDENT_AMBULATORY_CARE_PROVIDER_SITE_OTHER): Payer: BLUE CROSS/BLUE SHIELD | Admitting: Sports Medicine

## 2015-06-05 ENCOUNTER — Encounter: Payer: Self-pay | Admitting: Sports Medicine

## 2015-06-05 VITALS — BP 130/79 | HR 61 | Temp 98.1°F | Wt 169.0 lb

## 2015-06-05 DIAGNOSIS — J36 Peritonsillar abscess: Secondary | ICD-10-CM | POA: Diagnosis not present

## 2015-06-05 DIAGNOSIS — M79604 Pain in right leg: Secondary | ICD-10-CM | POA: Diagnosis not present

## 2015-06-05 MED ORDER — CEFTRIAXONE SODIUM 1 G IJ SOLR
1.0000 g | Freq: Once | INTRAMUSCULAR | Status: AC
  Administered 2015-06-05: 1 g via INTRAMUSCULAR

## 2015-06-05 NOTE — Assessment & Plan Note (Signed)
Improving significantly on 300 mg of clindamycin 3 times a day, pain is well controlled. Symptoms continue to improve, he will continue the antibiotics, I am going to give him a booster of Rocephin today. Return to see me if not better at the conclusion of his course of antibiotics.

## 2015-06-05 NOTE — Assessment & Plan Note (Signed)
He will return to see me if this flares up.

## 2015-06-05 NOTE — Patient Instructions (Signed)
Peritonsillar Abscess °A peritonsillar abscess is a collection of yellowish-white fluid (pus) in the back of the throat behind the tonsils. It usually occurs when an infection of the throat or tonsils (tonsillitis) spreads into the tissues around the tonsils. °CAUSES °The infection that leads to a peritonsillar abscess is usually caused by streptococcal bacteria.  °SIGNS AND SYMPTOMS °· Sore throat, often with pain on just one side. °· Swelling and tenderness of the glands (lymph nodes) in the neck. °· Difficulty swallowing. °· Difficulty opening your mouth. °· Fever. °· Chills. °· Drooling because of difficulty swallowing saliva. °· Headache. °· Changes in your voice. °· Bad breath. °DIAGNOSIS °Your health care provider will take your medical history and do a physical exam. Imaging tests may be done, such as an ultrasound or CT scan. A sample of pus may be removed from the abscess using a needle (needle aspiration) or by swabbing the back of your throat. This sample will be sent to a lab for testing. °TREATMENT °Treatment usually involves draining the pus from the abscess. This may be done through needle aspiration or by making an incision in the abscess. You will also likely need to take antibiotic medicine. °HOME CARE INSTRUCTIONS °· Rest as much as possible and get plenty of sleep. °· Take medicines only as directed by your health care provider. °· If you were prescribed an antibiotic medicine, finish it all even if you start to feel better. °· If your abscess was drained by your health care provider, gargle with a mixture of salt and warm water: °¨ Mix 1 tsp of salt in 8 oz of warm water. °¨ Gargle with this mixture four times per day or as needed for comfort. °¨ Do not swallow this mixture. °· Drink plenty of fluids. °· While your throat is sore, eat soft or liquid foods, such as frozen ice pops and ice cream. °· Keep all follow-up visits as directed by your health care provider. This is important. °SEEK  MEDICAL CARE IF: °· You have increased pain, swelling, redness, or drainage in your throat. °· You develop a headache, a lack of energy (lethargy), or generalized feelings of illness. °· You have a fever. °· You feel dizzy. °· You have difficulty swallowing or eating. °· You show signs of becoming dehydrated, such as: °¨ Light-headedness when standing. °¨ Decreased urine output. °¨ A fast heart rate. °¨ Dry mouth. °SEEK IMMEDIATE MEDICAL CARE IF:  °· You have difficulty talking or breathing, or you find it easier to breathe when you lean forward. °· You are coughing up blood or vomiting blood. °· You have severe throat pain that is not helped by medicines. °· You start to drool. °  °This information is not intended to replace advice given to you by your health care provider. Make sure you discuss any questions you have with your health care provider. °  °Document Released: 08/03/2005 Document Revised: 08/24/2014 Document Reviewed: 03/19/2014 °Elsevier Interactive Patient Education ©2016 Elsevier Inc. ° °

## 2015-06-05 NOTE — Progress Notes (Signed)
  Subjective:    CC: Peritonsillar abscess  HPI: Carlos Harvey was seen in urgent care 2 days ago with swelling of his left peritonsillar region, he was speaking, breathing, and swallowing appropriately so he is treated conservatively as appropriate. He was started on clindamycin 300 mg 3 times a day, prednisone, and pain medication and is here today's later feeling significantly better, no further fevers and pain is resolving.  Past medical history, Surgical history, Family history not pertinant except as noted below, Social history, Allergies, and medications have been entered into the medical record, reviewed, and no changes needed.   Review of Systems: No fevers, chills, night sweats, weight loss, chest pain, or shortness of breath.   Objective:    General: Well Developed, well nourished, and in no acute distress. Speaking full sentences Neuro: Alert and oriented x3, extra-ocular muscles intact, sensation grossly intact.  HEENT: Normocephalic, atraumatic, pupils equal round reactive to light, neck supple, no masses, thyroid nonpalpable. Nasopharynx, ear canals are unremarkable, oropharynx shows visibly swollen peritonsillar abscess without overlying exudates, he also has significant tender left-sided cervical lymphadenopathy. Skin: Warm and dry, no rashes. Cardiac: Regular rate and rhythm, no murmurs rubs or gallops, no lower extremity edema.  Respiratory: Clear to auscultation bilaterally. Not using accessory muscles, speaking in full sentences.  Rocephin 1000 mg intramuscular here in the office.  Impression and Recommendations:

## 2015-10-05 ENCOUNTER — Emergency Department
Admission: EM | Admit: 2015-10-05 | Discharge: 2015-10-05 | Disposition: A | Discharge status: Home / Self Care | Payer: BLUE CROSS/BLUE SHIELD | Source: Home / Self Care | Attending: Family Medicine | Admitting: Family Medicine

## 2015-10-05 ENCOUNTER — Encounter: Payer: Self-pay | Admitting: Emergency Medicine

## 2015-10-05 DIAGNOSIS — J039 Acute tonsillitis, unspecified: Secondary | ICD-10-CM | POA: Diagnosis not present

## 2015-10-05 LAB — POCT RAPID STREP A (OFFICE): Rapid Strep A Screen: NEGATIVE

## 2015-10-05 MED ORDER — CEFTRIAXONE SODIUM 1 G IJ SOLR
1.0000 g | Freq: Once | INTRAMUSCULAR | Status: AC
  Administered 2015-10-05: 1 g via INTRAMUSCULAR

## 2015-10-05 MED ORDER — PREDNISONE 20 MG PO TABS: ORAL_TABLET | ORAL | Status: DC

## 2015-10-05 MED ORDER — DEXAMETHASONE SODIUM PHOSPHATE 10 MG/ML IJ SOLN
10.0000 mg | Freq: Once | INTRAMUSCULAR | Status: AC
  Administered 2015-10-05: 10 mg via INTRAMUSCULAR

## 2015-10-05 MED ORDER — HYDROCODONE-ACETAMINOPHEN 7.5-325 MG/15ML PO SOLN: 15.0000 mL | Freq: Four times a day (QID) | ORAL | Status: DC | PRN

## 2015-10-05 MED ORDER — CLINDAMYCIN HCL 300 MG PO CAPS: 300.0000 mg | ORAL_CAPSULE | Freq: Three times a day (TID) | ORAL | Status: DC

## 2015-10-05 NOTE — Discharge Instructions (Signed)
Try warm salt water gargles for sore throat.   If symptoms become significantly worse during the night or over the weekend, proceed to the local emergency room.  Recommend follow-up with ENT physician in 7 to 10 days.   Tonsillitis Tonsillitis is an infection of the throat that causes the tonsils to become red, tender, and swollen. Tonsils are collections of lymphoid tissue at the back of the throat. Each tonsil has crevices (crypts). Tonsils help fight nose and throat infections and keep infection from spreading to other parts of the body for the first 18 months of life.  CAUSES Sudden (acute) tonsillitis is usually caused by infection with streptococcal bacteria. Long-lasting (chronic) tonsillitis occurs when the crypts of the tonsils become filled with pieces of food and bacteria, which makes it easy for the tonsils to become repeatedly infected. SYMPTOMS  Symptoms of tonsillitis include:  A sore throat, with possible difficulty swallowing.  White patches on the tonsils.  Fever.  Tiredness.  New episodes of snoring during sleep, when you did not snore before.  Small, foul-smelling, yellowish-white pieces of material (tonsilloliths) that you occasionally cough up or spit out. The tonsilloliths can also cause you to have bad breath. DIAGNOSIS Tonsillitis can be diagnosed through a physical exam. Diagnosis can be confirmed with the results of lab tests, including a throat culture. TREATMENT  The goals of tonsillitis treatment include the reduction of the severity and duration of symptoms and prevention of associated conditions. Symptoms of tonsillitis can be improved with the use of steroids to reduce the swelling. Tonsillitis caused by bacteria can be treated with antibiotic medicines. Usually, treatment with antibiotic medicines is started before the cause of the tonsillitis is known. However, if it is determined that the cause is not bacterial, antibiotic medicines will not treat the  tonsillitis. If attacks of tonsillitis are severe and frequent, your health care provider may recommend surgery to remove the tonsils (tonsillectomy). HOME CARE INSTRUCTIONS   Rest as much as possible and get plenty of sleep.  Drink plenty of fluids. While the throat is very sore, eat soft foods or liquids, such as sherbet, soups, or instant breakfast drinks.  Eat frozen ice pops.  Gargle with a warm or cold liquid to help soothe the throat. Mix 1/4 teaspoon of salt and 1/4 teaspoon of baking soda in 8 oz of water. SEEK MEDICAL CARE IF:   Large, tender lumps develop in your neck.  A rash develops.  A green, yellow-brown, or bloody substance is coughed up.  You are unable to swallow liquids or food for 24 hours.  You notice that only one of the tonsils is swollen. SEEK IMMEDIATE MEDICAL CARE IF:   You develop any new symptoms such as vomiting, severe headache, stiff neck, chest pain, or trouble breathing or swallowing.  You have severe throat pain along with drooling or voice changes.  You have severe pain, unrelieved with recommended medications.  You are unable to fully open the mouth.  You develop redness, swelling, or severe pain anywhere in the neck.  You have a fever. MAKE SURE YOU:   Understand these instructions.  Will watch your condition.  Will get help right away if you are not doing well or get worse.   This information is not intended to replace advice given to you by your health care provider. Make sure you discuss any questions you have with your health care provider.   Document Released: 05/13/2005 Document Revised: 08/24/2014 Document Reviewed: 01/20/2013 Elsevier Interactive Patient Education  2016 [place].

## 2015-10-05 NOTE — ED Provider Notes (Signed)
CSN: [id number]     Arrival date & time 10/05/15  0865 History   First MD Initiated Contact with Patient 10/05/15 1020     Chief Complaint  Patient presents with  . Sore Throat      HPI Comments: Patient developed a recurrent severe sore throat yesterday, worse on the left.  He has difficulty talking and swallowing, but is able to swallow his saliva and fluids.  The history is provided by the patient.    Past Medical History  Diagnosis Date  . Inguinal hernia     left   Past Surgical History  Procedure Laterality Date  . Lipoma excision  2007    buttock  . Inguinal hernia repair  03/09/11    left   History reviewed. No pertinent family history. Social History  Substance Use Topics  . Smoking status: Never Smoker   . Smokeless tobacco: None  . Alcohol Use: No    Review of Systems + sore throat No cough No pleuritic pain No wheezing No nasal congestion ? post-nasal drainage No sinus pain/pressure No itchy/red eyes + earache + dizzy No hemoptysis + SOB + fever, + chills No nausea No vomiting No abdominal pain No diarrhea No urinary symptoms No skin rash + fatigue + myalgias No headache Used OTC meds without relief  Allergies  Review of patient's allergies indicates no known allergies.  Home Medications   Prior to Admission medications   Medication Sig Start Date End Date Taking? Authorizing Provider  clindamycin (CLEOCIN) 300 MG capsule Take 1 capsule (300 mg total) by mouth 3 (three) times daily. 10/05/15   Lattie Haw, MD  cyclobenzaprine (FLEXERIL) 10 MG tablet Take one tab by mouth at bedtime for muscle spasm 05/27/15   Lattie Haw, MD  HYDROcodone-acetaminophen (HYCET) 7.5-325 mg/15 ml solution Take 15 mLs by mouth every 6 (six) hours as needed for moderate pain. 10/05/15 10/04/16  Lattie Haw, MD  ibuprofen (CHILD IBUPROFEN) 100 MG/5ML suspension Take 30 mLs (600 mg total) by mouth every 6 (six) hours as needed for fever, mild pain or  moderate pain. 06/03/15   Junius Finner, PA-C  meloxicam (MOBIC) 15 MG tablet Take 1 tablet (15 mg total) by mouth daily. Take with food each morning 05/27/15   Lattie Haw, MD  predniSONE (DELTASONE) 20 MG tablet 3 tabs po day one, then 2 po daily x 4 days. Begin 10/06/15. 10/05/15   Lattie Haw, MD  salicylic acid-lactic acid 17 % external solution Apply small amount to lesion at bedtime for up to two months. 11/19/14   Lattie Haw, MD   Meds Ordered and Administered this Visit   Medications  dexamethasone (DECADRON) injection 10 mg (not administered)  cefTRIAXone (ROCEPHIN) injection 1 g (not administered)    BP 102/66 mmHg  Pulse 96  Temp(Src) 99.3 F (37.4 C) (Oral)  Resp 16  Ht  (1.854 m)  Wt 158 lb (71.668 kg)  BMI 20.85 kg/m2  SpO2 100% No data found.   Physical Exam Nursing notes and Vital Signs reviewed. Appearance:  Patient appears stated age, and in no acute distress.  He is alert and oriented.  Eyes:  Pupils are equal, round, and reactive to light and accomodation.  Extraocular movement is intact.  Conjunctivae are not inflamed  Ears:  Canals normal.  Tympanic membranes normal.  Nose:   Normal turbinates.  No sinus tenderness.   Mouth:  Moist mucous membranes; mild trismus present  Pharynx:  Erythematous  with exudate.  Tonsils swollen, left larger than right, without obstruction.  Patient able to swallow his saliva Neck:  Supple.  Tender enlarged tonsillar and posterior nodes are palpated bilaterally, more prominent on left.  Lungs:  Clear to auscultation.  Breath sounds are equal.  Moving air well. Heart:  Regular rate and rhythm without murmurs, rubs, or gallops.  Abdomen:  Nontender without masses or hepatosplenomegaly.  Bowel sounds are present.  No CVA or flank tenderness.  Extremities:  No edema.  Skin:  No rash present.   ED Course  Procedures  None    Labs Reviewed  POCT RAPID STREP A (OFFICE) negative     MDM   1. Acute tonsillitis,  unspecified etiology; recurrent    Decadron  IM.  Rocephin 1gm IM Begin prednisone burst tomorrow.  Begin Clindamycin  TID Try warm salt water gargles for sore throat.   If symptoms become significantly worse during the night or over the weekend, proceed to the local emergency room.  Followup with Dr. Rodney Langton in 2 days. Recommend follow-up with ENT physician in 7 to 10 days (consider tonsillectomy)    Lattie Haw, MD 10/07/15 2158

## 2015-10-05 NOTE — ED Notes (Signed)
Reports onset of severe peritonsilar pain yesterday; now it is difficult to talk and swallow, although not to dangerous level. No OTCs in past 4 hours. Same condition 05/2016.

## 2015-12-11 ENCOUNTER — Encounter: Payer: Self-pay | Admitting: *Deleted

## 2015-12-11 ENCOUNTER — Emergency Department
Admission: EM | Admit: 2015-12-11 | Discharge: 2015-12-11 | Disposition: A | Discharge status: Home / Self Care | Payer: BLUE CROSS/BLUE SHIELD | Source: Home / Self Care | Attending: Family Medicine | Admitting: Family Medicine

## 2015-12-11 ENCOUNTER — Other Ambulatory Visit: Payer: Self-pay | Admitting: Emergency Medicine

## 2015-12-11 DIAGNOSIS — R21 Rash and other nonspecific skin eruption: Secondary | ICD-10-CM

## 2015-12-11 DIAGNOSIS — R112 Nausea with vomiting, unspecified: Secondary | ICD-10-CM

## 2015-12-11 DIAGNOSIS — R197 Diarrhea, unspecified: Secondary | ICD-10-CM

## 2015-12-11 DIAGNOSIS — R6883 Chills (without fever): Secondary | ICD-10-CM

## 2015-12-11 LAB — BASIC METABOLIC PANEL
BUN: 11 mg/dL (ref 7–25)
CO2: 26 mmol/L (ref 20–31)
Calcium: 9.5 mg/dL (ref 8.6–10.3)
Chloride: 105 mmol/L (ref 98–110)
Creat: 0.88 mg/dL (ref 0.60–1.35)
Glucose, Bld: 86 mg/dL (ref 65–99)
Potassium: 4.7 mmol/L (ref 3.5–5.3)
Sodium: 141 mmol/L (ref 135–146)

## 2015-12-11 LAB — POCT CBC W AUTO DIFF (K'VILLE URGENT CARE)

## 2015-12-11 MED ORDER — PROMETHAZINE HCL 25 MG PO TABS: 25.0000 mg | ORAL_TABLET | Freq: Four times a day (QID) | ORAL | Status: DC | PRN

## 2015-12-11 MED ORDER — DOXYCYCLINE HYCLATE 100 MG PO CAPS: 100.0000 mg | ORAL_CAPSULE | Freq: Two times a day (BID) | ORAL | Status: DC

## 2015-12-11 MED ORDER — ONDANSETRON 8 MG PO TBDP
8.0000 mg | ORAL_TABLET | Freq: Once | ORAL | Status: AC
  Administered 2015-12-11: 8 mg via ORAL

## 2015-12-11 NOTE — ED Notes (Signed)
Pt c/o itching rash all over x 5 days. He also reports chills and fatigue x last night with one episode of vomiting. Denies fever.

## 2015-12-11 NOTE — ED Provider Notes (Signed)
CSN: [id number]     Arrival date & time 12/11/15  1011 History   First MD Initiated Contact with Patient 12/11/15 1111     Chief Complaint  Patient presents with  . Rash  . Chills   (Consider location/radiation/quality/duration/timing/severity/associated sxs/prior Treatment) HPI The pt is a 24yo male presenting to [facility] with c/o mildly pruritic, erythematous rash on arms, legs, and trunk that started 5 days ago, associated chills, fatigue, nausea, and 1 episode of vomiting with diarrhea last night.  Denies sick contacts or recent travel. No known allergies besides pollen. Denies new soaps, lotions, or medications.  Denies chest pain, cough, congestion, or SOB.   Past Medical History  Diagnosis Date  . Inguinal hernia     left   Past Surgical History  Procedure Laterality Date  . Lipoma excision  2007    buttock  . Inguinal hernia repair  03/09/11    left   History reviewed. No pertinent family history. Social History  Substance Use Topics  . Smoking status: Never Smoker   . Smokeless tobacco: None  . Alcohol Use: Yes    Review of Systems  Constitutional: Positive for fever, chills and fatigue.  HENT: Negative for congestion, ear pain, sore throat, trouble swallowing and voice change.   Respiratory: Negative for cough and shortness of breath.   Cardiovascular: Negative for chest pain and palpitations.  Gastrointestinal: Positive for nausea, vomiting and diarrhea. Negative for abdominal pain and blood in stool.  Musculoskeletal: Negative for myalgias, back pain and arthralgias.  Skin: Positive for rash. Negative for wound.    Allergies  Review of patient's allergies indicates no known allergies.  Home Medications   Prior to Admission medications   Medication Sig Start Date End Date Taking? Authorizing Provider  doxycycline (VIBRAMYCIN) 100 MG capsule Take 1 capsule (100 mg total) by mouth 2 (two) times daily. One po bid x 7 days 12/11/15   Junius FinnerErin O'Malley, PA-C  promethazine  (PHENERGAN) 25 MG tablet Take 1 tablet (25 mg total) by mouth every 6 (six) hours as needed for nausea or vomiting. 12/11/15   Junius FinnerErin O'Malley, PA-C   Meds Ordered and Administered this Visit   Medications  ondansetron (ZOFRAN-ODT) disintegrating tablet 8 mg (8 mg Oral Given 12/11/15 1131)    BP 118/73 mmHg  Pulse 67  Temp(Src) 98.2 F (36.8 C) (Oral)  Resp 16  Ht 6\' 1"  (1.854 m)  Wt 159 lb (72.122 kg)  BMI 20.98 kg/m2  SpO2 100% No data found.   Physical Exam  Constitutional: He appears well-developed and well-nourished.  Pt sitting on exam table, appears well, non-toxic. NAD  HENT:  Head: Normocephalic and atraumatic.  Right Ear: Tympanic membrane normal.  Left Ear: Tympanic membrane normal.  Nose: Nose normal.  Mouth/Throat: Uvula is midline and mucous membranes are normal. Posterior oropharyngeal erythema ( mild) present. No oropharyngeal exudate, posterior oropharyngeal edema or tonsillar abscesses.  Eyes: Conjunctivae are normal. No scleral icterus.  Neck: Normal range of motion. Neck supple.  Cardiovascular: Normal rate, regular rhythm and normal heart sounds.   Pulmonary/Chest: Effort normal and breath sounds normal. No stridor. No respiratory distress. He has no wheezes. He has no rales. He exhibits no tenderness.  Abdominal: Soft. He exhibits no distension. There is no tenderness.  Musculoskeletal: Normal range of motion.  Lymphadenopathy:    He has no cervical adenopathy.  Neurological: He is alert.  Skin: Skin is warm and dry. Rash noted.  petechial type rash on arms, legs, and trunk. Rash  does not blanch. No induration or fluctuance. Non-tender.  Nursing note and vitals reviewed.   ED Course  Procedures (including critical care time)  Labs Review Labs Reviewed  BASIC METABOLIC PANEL  ROCKY MTN SPOTTED FVR ABS PNL(IGG+IGM)  B. BURGDORFI ANTIBODIES  POCT CBC W AUTO DIFF ([facility])    Imaging Review No results found.     MDM   1. Rash and  nonspecific skin eruption   2. Nausea vomiting and diarrhea   3. Chills    Pt c/o pruritic rash, fatigue, and chills.  Rash is petechial in appearance, does not blanch.  Vitals: WNL  CBC: WNL BMP, Lyme and RMSF labs pending.    Will treat empirically for tick borne illness due to rash with Doxycycline   Rx: Doxycycline and phenergan.  Advised pt to use acetaminophen and ibuprofen as needed for fever and pain. Encouraged rest and fluids. F/u with PCP in 5-7 days if not improving, sooner if worsening. Pt verbalized understanding and agreement with tx plan.     Junius Finner, PA-C 12/11/15 1207

## 2015-12-12 LAB — LYME AB/WESTERN BLOT REFLEX: B burgdorferi Ab IgG+IgM: <0.9 Index (ref <0.90)

## 2015-12-13 ENCOUNTER — Ambulatory Visit (INDEPENDENT_AMBULATORY_CARE_PROVIDER_SITE_OTHER): Payer: BLUE CROSS/BLUE SHIELD | Admitting: Sports Medicine

## 2015-12-13 ENCOUNTER — Encounter: Payer: Self-pay | Admitting: Sports Medicine

## 2015-12-13 VITALS — BP 108/66 | HR 72 | Resp 16 | Wt 162.0 lb

## 2015-12-13 DIAGNOSIS — M6740 Ganglion, unspecified site: Secondary | ICD-10-CM

## 2015-12-13 DIAGNOSIS — B3742 Candidal balanitis: Secondary | ICD-10-CM

## 2015-12-13 MED ORDER — CLOTRIMAZOLE-BETAMETHASONE 1-0.05 % EX CREA: 1.0000 "application " | TOPICAL_CREAM | Freq: Two times a day (BID) | CUTANEOUS | Status: DC

## 2015-12-13 NOTE — Assessment & Plan Note (Signed)
Injection as above, return in one month. 

## 2015-12-13 NOTE — Assessment & Plan Note (Signed)
Lotrisone, rechecking 4 weeks.

## 2015-12-13 NOTE — Patient Instructions (Signed)
Balanitis °Balanitis is inflammation of the head of the penis (glans).  °CAUSES  °Balanitis has multiple causes, both infectious and noninfectious. Often balanitis is the result of poor personal hygiene, especially in uncircumcised males. Without adequate washing, viruses, bacteria, and yeast collect between the foreskin and the glans. This can cause an infection. Lack of air and irritation from a normal secretion called smegma contribute to the cause in uncircumcised males. Other causes include: °· Chemical irritation from the use of certain soaps and shower gels (especially soaps with perfumes), condoms, personal lubricants, petroleum jelly, spermicides, and fabric conditioners. °· Skin conditions, such as eczema, dermatitis, and psoriasis. °· Allergies to drugs, such as tetracycline and sulfa. °· Certain medical conditions, including liver cirrhosis, congestive heart failure, and kidney disease. °· Morbid obesity. °RISK FACTORS °· Diabetes mellitus. °· A tight foreskin that is difficult to pull back past the glans (phimosis). °· Sex without the use of a condom. °SIGNS AND SYMPTOMS  °Symptoms may include: °· Discharge coming from under the foreskin. °· Tenderness. °· Itching and inability to get an erection (because of the pain). °· Redness and a rash. °· Sores on the glans and on the foreskin. °DIAGNOSIS °Diagnosis of balanitis is confirmed through a physical exam. °TREATMENT °The treatment is based on the cause of the balanitis. Treatment may include: °· Frequent cleansing. °· Keeping the glans and foreskin dry. °· Use of medicines such as creams, pain medicines, antibiotics, or medicines to treat fungal infections. °· Sitz baths. °If the irritation has caused a scar on the foreskin that prevents easy retraction, a circumcision may be recommended.  °HOME CARE INSTRUCTIONS °· Sex should be avoided until the condition has cleared. °MAKE SURE YOU: °· Understand these instructions. °· Will watch your  condition. °· Will get help right away if you are not doing well or get worse. °  °This information is not intended to replace advice given to you by your health care provider. Make sure you discuss any questions you have with your health care provider. °  °Document Released: 12/20/2008 Document Revised: 08/08/2013 Document Reviewed: 01/23/2013 °Elsevier Interactive Patient Education ©2016 [employer] ° °

## 2015-12-13 NOTE — Progress Notes (Signed)
  Subjective:    CC: right wrist pain, penile rash  HPI: Right wrist: No ganglion cyst, persistent pain, this responded well to conservative measures initially but he is ready for interventional treatment.  Penile rash: Present for a few weeks, pruritic. No discharge, no lesions, new sexual partners, no dysuria. No constitutional symptoms.  Past medical history, Surgical history, Family history not pertinant except as noted below, Social history, Allergies, and medications have been entered into the medical record, reviewed, and no changes needed.   Review of Systems: No fevers, chills, night sweats, weight loss, chest pain, or shortness of breath.   Objective:    General: Well Developed, well nourished, and in no acute distress.  Neuro: Alert and oriented x3, extra-ocular muscles intact, sensation grossly intact.  HEENT: Normocephalic, atraumatic, pupils equal round reactive to light, neck supple, no masses, no lymphadenopathy, thyroid nonpalpable.  Skin: Warm and dry, no rashes. Cardiac: Regular rate and rhythm, no murmurs rubs or gallops, no lower extremity edema.  Respiratory: Clear to auscultation bilaterally. Not using accessory muscles, speaking in full sentences. Right Wrist: Visible and palpable ganglion protruding from the radiocarpal joint, tender. ROM smooth and normal with good flexion and extension and ulnar/radial deviation that is symmetrical with opposite wrist. Palpation is normal over metacarpals, navicular, lunate, and TFCC; tendons without tenderness/ swelling No snuffbox tenderness. No tenderness over Canal of [place]. Strength 5/5 in all directions without pain. Negative Finkelstein, tinel's and phalens. Negative Watson's test. Genitalia: No penile or scrotal ulcerations, lesions, there is mild erythema around the glans and distal foreskin, no breaks in the skin.  Procedure: Real-time Ultrasound Guided Injection of right radiocarpal ganglion cyst Device: GE Logiq  E  Verbal informed consent obtained.  Time-out conducted.  Noted no overlying erythema, induration, or other signs of local infection.  Skin prepped in a sterile fashion.  Local anesthesia: Topical Ethyl chloride.  With sterile technique and under real time ultrasound guidance:  30-gauge needle advanced into the ganglion and 1/2 mL lidocaine, 1/2 mL kenalog 40 injected easily. Completed without difficulty  Pain immediately resolved suggesting accurate placement of the medication.  Advised to call if fevers/chills, erythema, induration, drainage, or persistent bleeding.  Images permanently stored and available for review in the ultrasound unit.  Impression: Technically successful ultrasound guided injection.  Impression and Recommendations:

## 2015-12-16 LAB — ROCKY MTN SPOTTED FVR ABS PNL(IGG+IGM)
RMSF IgG: NOT DETECTED
RMSF IgM: NOT DETECTED

## 2015-12-17 ENCOUNTER — Telehealth: Payer: Self-pay | Admitting: *Deleted

## 2016-05-23 IMAGING — CR DG WRIST COMPLETE 3+V*R*
2 series · 2 of 2 positions shown · non-contrast
Comparison: None.

CLINICAL DATA: Right wrist pain after an injury.

EXAM:
RIGHT WRIST - COMPLETE 3+ VIEW

[view not recorded (1 of 2)]
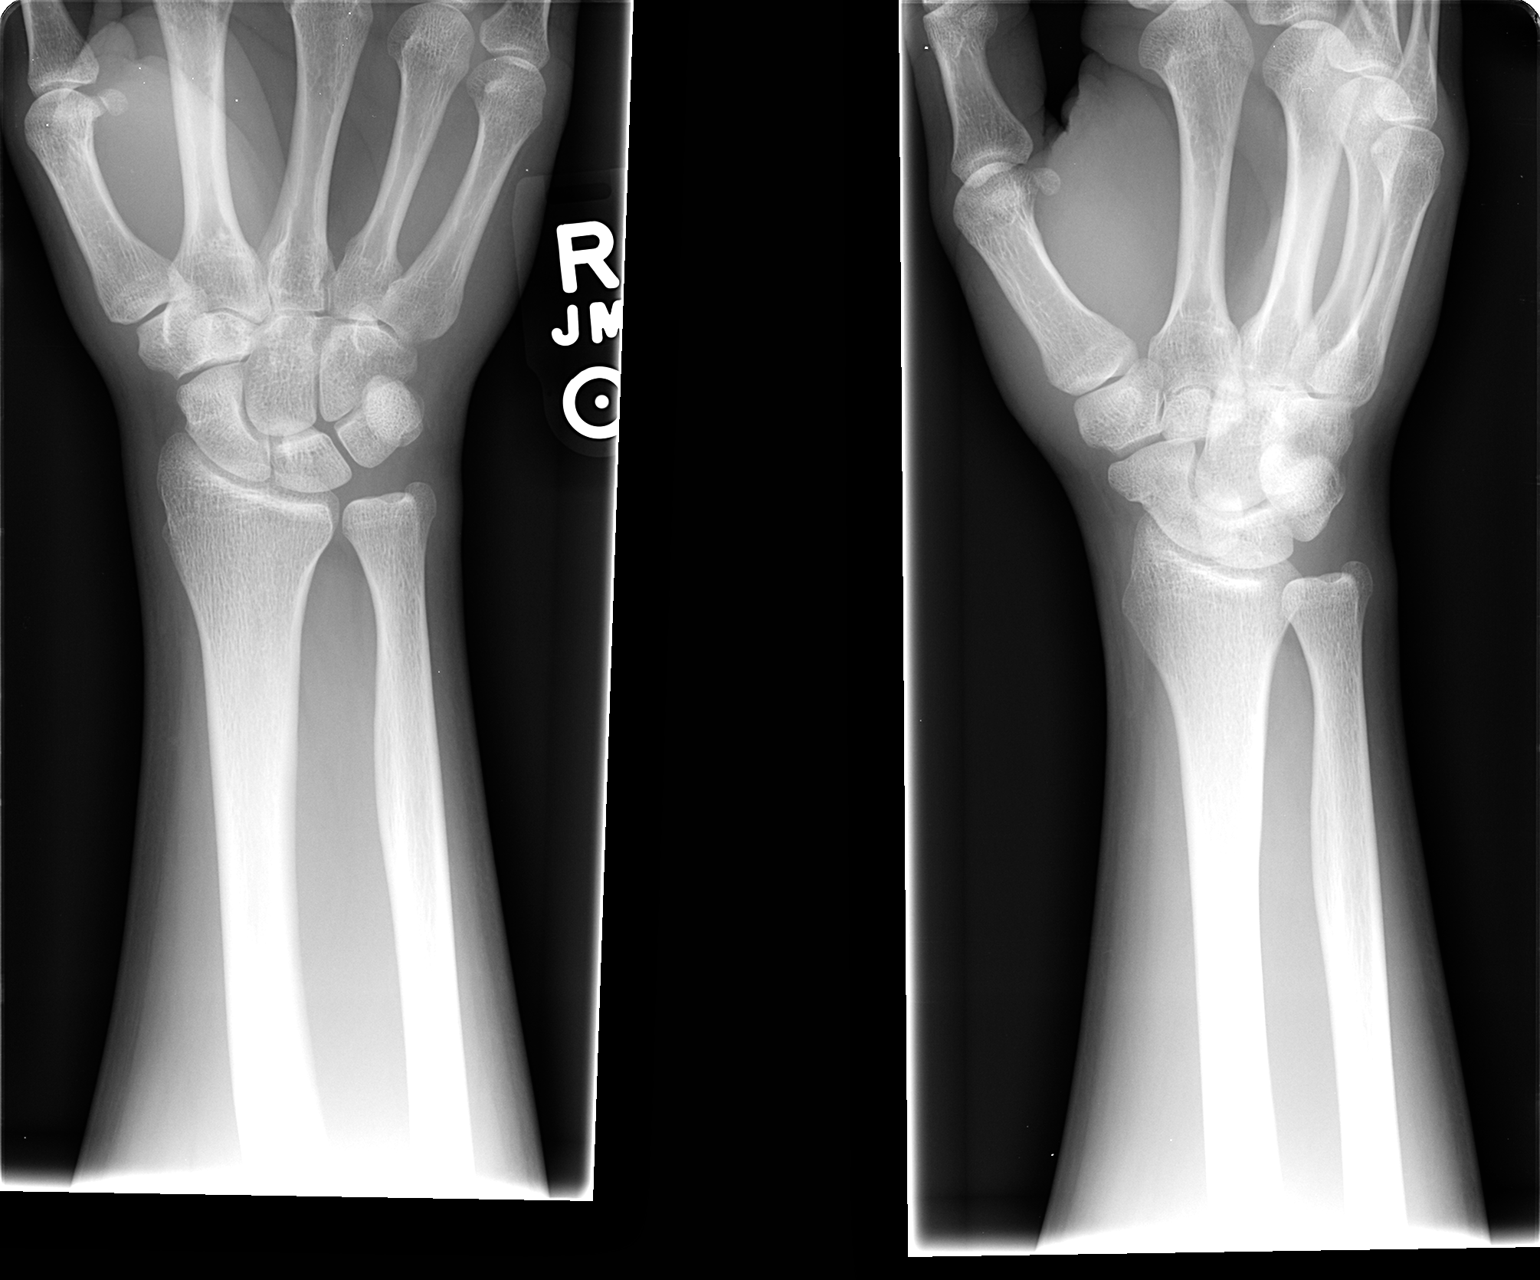

[view not recorded (2 of 2)]
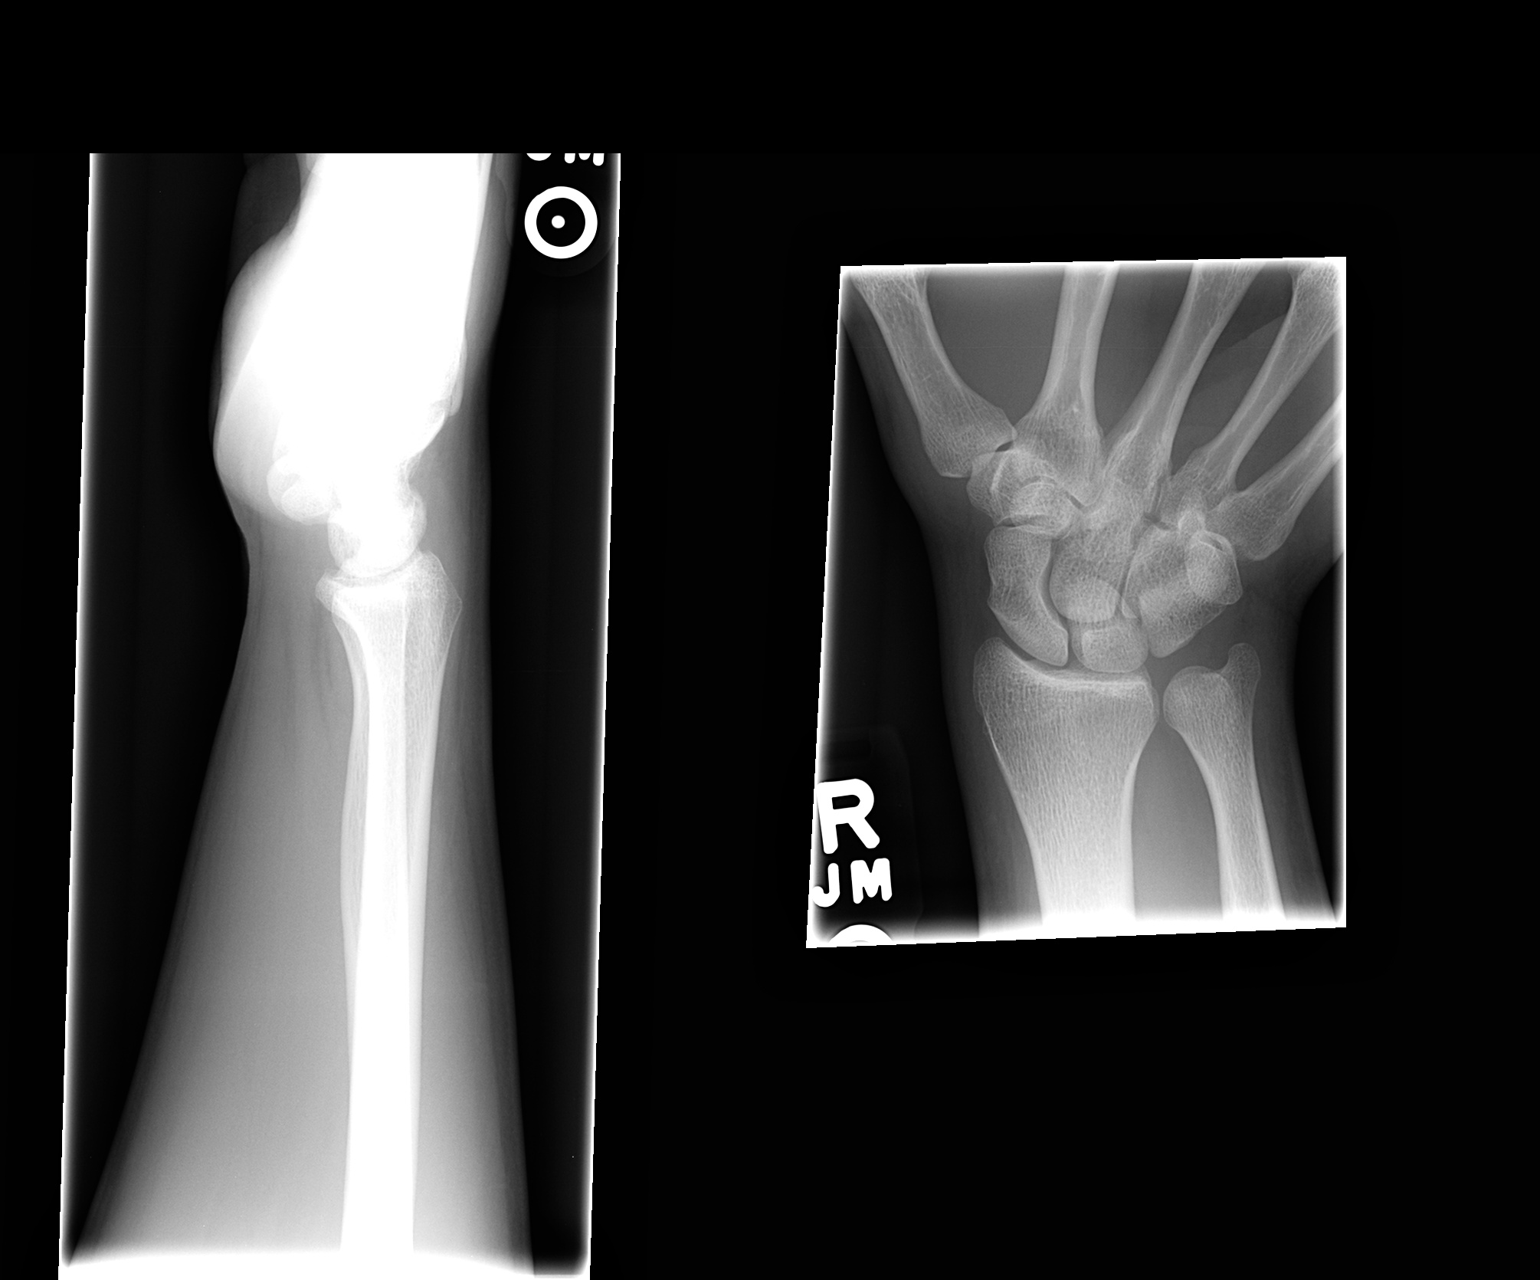

[2 of 2 positions shown; findings below may reference images not displayed]

FINDINGS: No acute osseous or joint abnormality.
IMPRESSION: No acute osseous or joint abnormality.

## 2017-04-26 ENCOUNTER — Encounter: Payer: Self-pay | Admitting: Emergency Medicine

## 2017-04-26 ENCOUNTER — Emergency Department (INDEPENDENT_AMBULATORY_CARE_PROVIDER_SITE_OTHER)
Admission: EM | Admit: 2017-04-26 | Discharge: 2017-04-26 | Disposition: A | Discharge status: Home / Self Care | Payer: Self-pay | Source: Home / Self Care | Attending: Family Medicine | Admitting: Family Medicine

## 2017-04-26 DIAGNOSIS — Z23 Encounter for immunization: Secondary | ICD-10-CM

## 2017-04-26 DIAGNOSIS — S61012A Laceration without foreign body of left thumb without damage to nail, initial encounter: Secondary | ICD-10-CM

## 2017-04-26 MED ORDER — TETANUS-DIPHTH-ACELL PERTUSSIS 5-2.5-18.5 LF-MCG/0.5 IM SUSP
0.5000 mL | Freq: Once | INTRAMUSCULAR | Status: AC
  Administered 2017-04-26: 0.5 mL via INTRAMUSCULAR

## 2017-04-26 NOTE — Discharge Instructions (Signed)
Change dressing daily and apply Bacitracin ointment to wound.  Keep wound clean and dry.  Return for any signs of infection (or follow-up with family doctor):  Increasing redness, swelling, pain, heat, drainage, etc.  May take Ibuprofen or Tylenol if needed for pain. Return in 10 days for suture removal.

## 2017-04-26 NOTE — ED Provider Notes (Signed)
[facility]    CSN: [id number] Arrival date & time: 04/26/17  1942     History   Chief Complaint Chief Complaint  Patient presents with  . Laceration    HPI Carlos Harvey is a 26 y.o. male.   Patient lacerated his left thumb approximately 30 minutes ago with a kitchen knife while cutting an avocado.  He does not remember his last Tdap.   The history is provided by the patient.  Laceration  Location:  Finger Finger laceration location:  L thumb Length:  2cm Depth:  Through dermis Quality: straight   Bleeding: controlled   Time since incident:  30 minutes Laceration mechanism:  Knife Pain details:    Quality:  Aching   Severity:  Mild   Timing:  Constant   Progression:  Unchanged Foreign body present:  No foreign bodies Relieved by:  None tried Worsened by:  Movement Ineffective treatments:  None tried Tetanus status:  Out of date Associated symptoms: no numbness and no swelling     Past Medical History:  Diagnosis Date  . Inguinal hernia    left    Patient Active Problem List   Diagnosis Date Noted  . Candidal balanitis 12/13/2015  . Peritonsillar abscess 06/05/2015  . Right wrist pain 06/11/2014  . Right leg pain 01/18/2014  . Ganglion cyst 11/09/2013  . Inguinal hernia unilateral, non-recurrent, left 04/03/2011    Past Surgical History:  Procedure Laterality Date  . INGUINAL HERNIA REPAIR  03/09/11   left  . LIPOMA EXCISION  2007   buttock       Home Medications    Prior to Admission medications   Medication Sig Start Date End Date Taking? Authorizing Provider  clotrimazole-betamethasone (LOTRISONE) cream Apply 1 application topically 2 (two) times daily. 12/13/15   Monica Bectonhekkekandam, Thomas J, MD  doxycycline (VIBRAMYCIN) 100 MG capsule Take 1 capsule (100 mg total) by mouth 2 (two) times daily. One po bid x 7 days 12/11/15   Lurene ShadowPhelps, Erin O, PA-C  promethazine (PHENERGAN) 25 MG tablet Take 1 tablet (25 mg total) by mouth every 6 (six)  hours as needed for nausea or vomiting. 12/11/15   Lurene ShadowPhelps, Erin O, PA-C    Family History History reviewed. No pertinent family history.  Social History Social History  Substance Use Topics  . Smoking status: Never Smoker  . Smokeless tobacco: Never Used  . Alcohol use Yes     Allergies   Patient has no known allergies.   Review of Systems Review of Systems  All other systems reviewed and are negative.    Physical Exam Triage Vital Signs ED Triage Vitals  Enc Vitals Group     BP 04/26/17 1952 137/86     Pulse Rate 04/26/17 1952 90     Resp --      Temp 04/26/17 1952 98.2 F (36.8 C)     Temp Source 04/26/17 1952 Oral     SpO2 04/26/17 1952 100 %     Weight --      Height --      Head Circumference --      Peak Flow --      Pain Score 04/26/17 1953 5     Pain Loc --      Pain Edu? --      Excl. in [place]? --    No data found.   Updated Vital Signs BP 137/86 (BP Location: Right Arm)   Pulse 90   Temp 98.2 F (36.8 C) (  Oral)   SpO2 100%   Visual Acuity Right Eye Distance:   Left Eye Distance:   Bilateral Distance:    Right Eye Near:   Left Eye Near:    Bilateral Near:     Physical Exam  Constitutional: He appears well-developed and well-nourished. No distress.  HENT:  Head: Normocephalic.  Eyes: Pupils are equal, round, and reactive to light.  Cardiovascular: Normal rate.   Pulmonary/Chest: Effort normal.  Musculoskeletal:       Left hand: He exhibits tenderness and laceration. He exhibits normal range of motion, no bony tenderness, normal two-point discrimination, normal capillary refill, no deformity and no swelling. Normal sensation noted.       Hands: Volar base of left thumb has a simple superficial 2cm long laceration as noted on diagram.   Left thumb has full range of motion all joints.  Distal neurovascular function is intact.   Neurological: He is alert.  Skin: Skin is warm and dry.  Nursing note and vitals reviewed.    UC Treatments /  Results  Labs (all labs ordered are listed, but only abnormal results are displayed) Labs Reviewed - No data to display  EKG  EKG Interpretation None       Radiology No results found.  Procedures Procedures  Laceration Repair Discussed benefits and risks of procedure and verbal consent obtained. Using sterile technique and local anesthesia with 1% lidocaine without epinephrine, cleansed wound with Betadine followed by copious lavage with normal saline.  Wound carefully inspected for debris and foreign bodies; none found.  Wound closed with #5, 5-0 interrupted nylon sutures.  Bacitracin and non-stick sterile dressing applied.  Wound precautions explained to patient.  Return for suture removal in 10 days.   Medications Ordered in UC Medications  Tdap (BOOSTRIX) injection 0.5 mL (0.5 mLs Intramuscular Given 04/26/17 1956)     Initial Impression / Assessment and Plan / UC Course  I have reviewed the triage vital signs and the nursing notes.  Pertinent labs & imaging results that were available during my care of the patient were reviewed by me and considered in my medical decision making (see chart for details).    Administered Tdap  Change dressing daily and apply Bacitracin ointment to wound.  Keep wound clean and dry.  Return for any signs of infection (or follow-up with family doctor):  Increasing redness, swelling, pain, heat, drainage, etc.  May take Ibuprofen or Tylenol if needed for pain. Return in 10 days for suture removal.      Final Clinical Impressions(s) / UC Diagnoses   Final diagnoses:  Laceration of left thumb without foreign body without damage to nail, initial encounter    New Prescriptions New Prescriptions   No medications on file        Lattie Haw, MD 04/26/17 2018

## 2017-04-26 NOTE — ED Triage Notes (Signed)
Pt cut his left thumb with a knife while cooking about 30 mins ago. Unsure of last tetanus.

## 2019-04-25 ENCOUNTER — Other Ambulatory Visit: Payer: Self-pay

## 2019-04-25 ENCOUNTER — Emergency Department (INDEPENDENT_AMBULATORY_CARE_PROVIDER_SITE_OTHER)
Admission: EM | Admit: 2019-04-25 | Discharge: 2019-04-25 | Disposition: A | Discharge status: Home / Self Care | Payer: PRIVATE HEALTH INSURANCE | Source: Home / Self Care | Attending: Emergency Medicine | Admitting: Emergency Medicine

## 2019-04-25 DIAGNOSIS — B029 Zoster without complications: Secondary | ICD-10-CM | POA: Diagnosis not present

## 2019-04-25 DIAGNOSIS — Z7689 Persons encountering health services in other specified circumstances: Secondary | ICD-10-CM

## 2019-04-25 MED ORDER — VALACYCLOVIR HCL 1 G PO TABS: 1000.0000 mg | ORAL_TABLET | Freq: Three times a day (TID) | ORAL | 0 refills | Status: AC

## 2019-04-25 NOTE — ED Triage Notes (Signed)
Rash started Sunday morning, on right side of lower chest spreading to the back

## 2019-04-25 NOTE — Discharge Instructions (Signed)
Schedule an appointment with a primary care physician to be seen in 1 week. Clean area with soap and water 3 times a day. Take antiviral medication as instructed. We will call you with results of your other test.

## 2019-04-25 NOTE — ED Provider Notes (Signed)
Carlos Harvey Harvey    CSN: [id number] Arrival date & time: 04/25/19  0914      History   Chief Complaint Chief Complaint  Patient presents with  . Rash    HPI Carlos Harvey is a 28 y.o. male.  Patient began having pain on the right side of his chest 2 days ago.  Yesterday evening he noticed a rash in the area which has been burning with blisters.  He has not been ill.  He has no known ongoing medical problems.  Of note he has been under a lot of stress recently.  His longtime girlfriend had an affair and he does have STD concerns. HPI  Past Medical History:  Diagnosis Date  . Inguinal hernia    left    Patient Active Problem List   Diagnosis Date Noted  . Candidal balanitis 12/13/2015  . Peritonsillar abscess 06/05/2015  . Right wrist pain 06/11/2014  . Right leg pain 01/18/2014  . Ganglion cyst 11/09/2013  . Inguinal hernia unilateral, non-recurrent, left 04/03/2011    Past Surgical History:  Procedure Laterality Date  . INGUINAL HERNIA REPAIR  03/09/11   left  . LIPOMA EXCISION  2007   buttock       Home Medications    Prior to Admission medications   Medication Sig Start Date End Date Taking? Authorizing Provider  clotrimazole-betamethasone (LOTRISONE) cream Apply 1 application topically 2 (two) times daily. 12/13/15   Silverio Decamp, MD  doxycycline (VIBRAMYCIN) 100 MG capsule Take 1 capsule (100 mg total) by mouth 2 (two) times daily. One po bid x 7 days 12/11/15   Noe Gens, PA-C  promethazine (PHENERGAN) 25 MG tablet Take 1 tablet (25 mg total) by mouth every 6 (six) hours as needed for nausea or vomiting. 12/11/15   Noe Gens, PA-C  valACYclovir (VALTREX) 1000 MG tablet Take 1 tablet (1,000 mg total) by mouth 3 (three) times daily for 7 days. 04/25/19 05/02/19  Darlyne Russian, MD    Family History History reviewed. No pertinent family history.  Social History Social History   Tobacco Use  . Smoking status: Never Smoker  .  Smokeless tobacco: Never Used  Substance Use Topics  . Alcohol use: Yes  . Drug use: No     Allergies   Patient has no known allergies.   Review of Systems Review of Systems  Constitutional: Negative.   HENT: Negative.   Respiratory: Negative.   Cardiovascular: Negative.   Skin:       Patient has a blisterlike rash on the right side of his chest.     Physical Exam Triage Vital Signs ED Triage Vitals  Enc Vitals Group     BP 04/25/19 0947 (!) 143/85     Pulse Rate 04/25/19 0947 70     Resp 04/25/19 0947 20     Temp 04/25/19 0947 98.2 F (36.8 C)     Temp Source 04/25/19 0947 Oral     SpO2 04/25/19 0947 99 %     Weight 04/25/19 0948 171 lb (77.6 kg)     Height 04/25/19 0948 6\' 1"  (2.353 m)     Head Circumference --      Peak Flow --      Pain Score 04/25/19 0948 4     Pain Loc --      Pain Edu? --      Excl. in [place]? --    No data found.  Updated Vital Signs BP (!) 143/85 (  BP Location: Right Arm)   Pulse 70   Temp 98.2 F (36.8 C) (Oral)   Resp 20   Ht 6\' 1"  (1.854 m)   Wt 77.6 kg   SpO2 99%   BMI 22.56 kg/m   Visual Acuity Right Eye Distance:   Left Eye Distance:   Bilateral Distance:    Right Eye Near:   Left Eye Near:    Bilateral Near:     Physical Exam Constitutional:      Appearance: Normal appearance.  HENT:     Head: Normocephalic.     Nose: Nose normal.  Cardiovascular:     Rate and Rhythm: Normal rate and regular rhythm.  Pulmonary:     Effort: Pulmonary effort is normal.  Skin:    Comments: There is a blister rash which extends in the T6-T7 distribution right side of the chest and stops in the midline.  Neurological:     Mental Status: He is alert.      UC Treatments / Results  Labs (all labs ordered are listed, but only abnormal results are displayed) Labs Reviewed  GC/CHLAMYDIA PROBE AMP  HIV ANTIBODY (ROUTINE TESTING W REFLEX)  RPR  HEPATITIS C ANTIBODY    EKG   Radiology No results found.  Procedures  Procedures (including critical Harvey time)  Medications Ordered in UC Medications - No data to display  Initial Impression / Assessment and Plan / UC Course  I have reviewed the triage vital signs and the nursing notes. Patient has a shingles type eruption on the right side of his chest.  Will treat with Valtrex.  He also has been under a great deal of stress recently and recent break-up with a girlfriend who was also seeing another person.  He does have STD concerns and will check for this during this office visit Pertinent labs & imaging results that were available during my Harvey of the patient were reviewed by me and considered in my medical decision making (see chart for details).       Final Clinical Impressions(s) / UC Diagnoses   Final diagnoses:  Herpes zoster without complication  Encounter for assessment of STD exposure     Discharge Instructions     Schedule an appointment with a primary Harvey physician to be seen in 1 week. Clean area with soap and water 3 times a day. Take antiviral medication as instructed. We will call you with results of your other test.    ED Prescriptions    Medication Sig Dispense Auth. Provider   valACYclovir (VALTREX) 1000 MG tablet Take 1 tablet (1,000 mg total) by mouth 3 (three) times daily for 7 days. 21 tablet Collene Gobbleaub, Kayveon Lennartz A, MD     Controlled Substance Prescriptions [place] Controlled Substance Registry consulted? Not Applicable   Collene Gobbleaub, Matheo Rathbone A, MD 04/25/19 1434

## 2019-04-26 LAB — C. TRACHOMATIS/N. GONORRHOEAE RNA
C. trachomatis RNA, TMA: NOT DETECTED
N. gonorrhoeae RNA, TMA: NOT DETECTED

## 2019-04-26 LAB — HEPATITIS C ANTIBODY
Hepatitis C Ab: NONREACTIVE
SIGNAL TO CUT-OFF: 0.01 (ref <1.00)

## 2019-04-26 LAB — RPR: RPR Ser Ql: NONREACTIVE

## 2019-04-26 LAB — HIV ANTIBODY (ROUTINE TESTING W REFLEX): HIV 1&2 Ab, 4th Generation: NONREACTIVE

## 2019-04-27 ENCOUNTER — Telehealth: Payer: Self-pay

## 2019-04-27 NOTE — Telephone Encounter (Signed)
Left voice message inquiring about patients status. Informed that all bw was neg. Encouraged patient to call with questions or concerns.

## 2019-06-26 ENCOUNTER — Other Ambulatory Visit: Payer: Self-pay

## 2019-06-26 ENCOUNTER — Encounter: Payer: Self-pay | Admitting: Sports Medicine

## 2019-06-26 ENCOUNTER — Ambulatory Visit (INDEPENDENT_AMBULATORY_CARE_PROVIDER_SITE_OTHER): Payer: PRIVATE HEALTH INSURANCE | Admitting: Sports Medicine

## 2019-06-26 DIAGNOSIS — M25531 Pain in right wrist: Secondary | ICD-10-CM | POA: Diagnosis not present

## 2019-06-26 MED ORDER — HYDROCODONE-ACETAMINOPHEN 10-325 MG PO TABS: 1.0000 | ORAL_TABLET | Freq: Three times a day (TID) | ORAL | 0 refills | Status: DC | PRN
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
## 2019-06-26 NOTE — Assessment & Plan Note (Signed)
Severe pain across the ASIS bilaterally. Pain in the right wrist. X-rays of the pelvis, right wrist, Velcro brace on the right wrist. He will return tomorrow for a CT of the pelvis without contrast. Hydrocodone for pain.

## 2019-06-27 ENCOUNTER — Ambulatory Visit (INDEPENDENT_AMBULATORY_CARE_PROVIDER_SITE_OTHER): Payer: Self-pay

## 2019-06-27 DIAGNOSIS — M25531 Pain in right wrist: Secondary | ICD-10-CM

## 2019-06-27 DIAGNOSIS — S3991XA Unspecified injury of abdomen, initial encounter: Secondary | ICD-10-CM

## 2019-06-27 DIAGNOSIS — R102 Pelvic and perineal pain: Secondary | ICD-10-CM

## 2019-07-10 ENCOUNTER — Telehealth: Payer: Self-pay | Admitting: Sports Medicine

## 2019-07-10 ENCOUNTER — Ambulatory Visit: Payer: PRIVATE HEALTH INSURANCE | Admitting: Sports Medicine

## 2019-07-10 ENCOUNTER — Encounter: Payer: Self-pay | Admitting: Sports Medicine

## 2019-07-10 NOTE — Telephone Encounter (Signed)
He was in a lot of pain, if he is better then absolutely I can write the letter, and he can just download it from my chart.  Let me know.

## 2019-07-10 NOTE — Telephone Encounter (Signed)
Pt notified that letter is ready for pick up

## 2019-07-10 NOTE — Telephone Encounter (Signed)
Letter written

## 2019-07-10 NOTE — Telephone Encounter (Signed)
Spoke with pt and he is all good and pain free.  He does not have mychart so I will just print the letter and place it with front office for him to pick up.

## 2019-11-28 ENCOUNTER — Encounter: Payer: Self-pay | Admitting: Sports Medicine

## 2019-11-28 ENCOUNTER — Other Ambulatory Visit: Payer: Self-pay

## 2019-11-28 ENCOUNTER — Ambulatory Visit (INDEPENDENT_AMBULATORY_CARE_PROVIDER_SITE_OTHER): Payer: PRIVATE HEALTH INSURANCE | Admitting: Sports Medicine

## 2019-11-28 DIAGNOSIS — F329 Major depressive disorder, single episode, unspecified: Secondary | ICD-10-CM | POA: Diagnosis not present

## 2019-11-28 DIAGNOSIS — F32A Depression, unspecified: Secondary | ICD-10-CM | POA: Insufficient documentation

## 2019-11-28 DIAGNOSIS — F419 Anxiety disorder, unspecified: Secondary | ICD-10-CM

## 2019-11-28 MED ORDER — SERTRALINE HCL 25 MG PO TABS: 25.0000 mg | ORAL_TABLET | Freq: Every day | ORAL | 2 refills | Status: DC

## 2019-11-28 NOTE — Progress Notes (Signed)
    Procedures performed today:    None.  Independent interpretation of notes and tests performed by another provider:   None.  Brief History, Exam, Impression, and Recommendations:    Anxiety and depression Is a pleasant 29 year old male, he has had a longstanding history of anxiety and depression, he has tried to treat himself with therapy, spirituality, as well as mindfulness. Unfortunately continues to have symptoms and he is agreeable to discuss pharmacologic intervention. I would like him to continue his other alternative treatments, and we are going to add Zoloft 25 mg daily, we will recheck a PHQ and GAD in 4 weeks, I did discuss the monoamine hypothesis, and he understands that we will do a dose titration, follow-up visits can be virtual. I have given him several blank PHQ/GAD forms that he knows to fill out prior to his appointments.    ___________________________________________ Ihor Austin. Benjamin Stain, M.D., ABFM., CAQSM. Primary Care and Sports Medicine [facility]  Adjunct Instructor of Family Medicine  [employer]

## 2019-11-28 NOTE — Assessment & Plan Note (Addendum)
Is a pleasant 29 year old male, he has had a longstanding history of anxiety and depression, he has tried to treat himself with therapy, spirituality, as well as mindfulness. Unfortunately continues to have symptoms and he is agreeable to discuss pharmacologic intervention. I would like him to continue his other alternative treatments, and we are going to add Zoloft 25 mg daily, we will recheck a PHQ and GAD in 4 weeks, I did discuss the monoamine hypothesis, and he understands that we will do a dose titration, follow-up visits can be virtual. I have given him several blank PHQ/GAD forms that he knows to fill out prior to his appointments.

## 2019-12-26 ENCOUNTER — Telehealth (INDEPENDENT_AMBULATORY_CARE_PROVIDER_SITE_OTHER): Payer: PRIVATE HEALTH INSURANCE | Admitting: Sports Medicine

## 2019-12-26 DIAGNOSIS — F419 Anxiety disorder, unspecified: Secondary | ICD-10-CM

## 2019-12-26 DIAGNOSIS — F329 Major depressive disorder, single episode, unspecified: Secondary | ICD-10-CM

## 2019-12-26 DIAGNOSIS — F32A Depression, unspecified: Secondary | ICD-10-CM

## 2019-12-26 MED ORDER — SERTRALINE HCL 100 MG PO TABS: 100.0000 mg | ORAL_TABLET | Freq: Every day | ORAL | 3 refills | Status: DC

## 2019-12-26 NOTE — Assessment & Plan Note (Signed)
Carlos Harvey returns, his PHQ-9 score has improved slightly, GAD-7 score is worsened, he does note a slight improvement in his depressive and anxiety symptoms since starting 25 mg of Zoloft. He understands the mechanism of action and the amount of time needed for this medication to start working. He does endorse some suicidal ideation, but contracts with me for safety, he understands that if he were to commit suicide in the form of driving his car off of a bridge that he would leave behind a living family and he tells me he feels like that would be a very selfish move that he would never do. Increasing Zoloft to 100 mg daily. Follow-up with me in 4 weeks, he will let me know sooner if things change.

## 2019-12-26 NOTE — Progress Notes (Signed)
   Virtual Visit via WebEx/MyChart   I connected with  Carlos Harvey  on 12/26/19 via WebEx/MyChart/Doximity Video and verified that I am speaking with the correct person using two identifiers.   I discussed the limitations, risks, security and privacy concerns of performing an evaluation and management service by WebEx/MyChart/Doximity Video, including the higher likelihood of inaccurate diagnosis and treatment, and the availability of in person appointments.  We also discussed the likely need of an additional face to face encounter for complete and high quality delivery of care.  I also discussed with the patient that there may be a patient responsible charge related to this service. The patient expressed understanding and wishes to proceed.  Provider location is either at home or medical facility. Patient location is at their home, different from provider location. People involved in care of the patient during this telehealth encounter were myself, my nurse/medical assistant, and my front office/scheduling team member.  Review of Systems: No fevers, chills, night sweats, weight loss, chest pain, or shortness of breath.   Objective Findings:    General: Speaking full sentences, no audible heavy breathing.  Sounds alert and appropriately interactive.  Appears well.  Face symmetric.  Extraocular movements intact.  Pupils equal and round.  No nasal flaring or accessory muscle use visualized.  Independent interpretation of tests performed by another provider:   None.  Brief History, Exam, Impression, and Recommendations:    Anxiety and depression Carlos Harvey returns, his PHQ-9 score has improved slightly, GAD-7 score is worsened, he does note a slight improvement in his depressive and anxiety symptoms since starting 25 mg of Zoloft. He understands the mechanism of action and the amount of time needed for this medication to start working. He does endorse some suicidal ideation, but contracts with me  for safety, he understands that if he were to commit suicide in the form of driving his car off of a bridge that he would leave behind a living family and he tells me he feels like that would be a very selfish move that he would never do. Increasing Zoloft to 100 mg daily. Follow-up with me in 4 weeks, he will let me know sooner if things change.   I discussed the above assessment and treatment plan with the patient. The patient was provided an opportunity to ask questions and all were answered. The patient agreed with the plan and demonstrated an understanding of the instructions.   The patient was advised to call back or seek an in-person evaluation if the symptoms worsen or if the condition fails to improve as anticipated.   I provided 30 minutes of face to face and non-face-to-face time during this encounter date, time was needed to gather information, review chart, records, communicate/coordinate with staff remotely, as well as complete documentation.   ___________________________________________ Carlos Harvey. Benjamin Stain, M.D., ABFM., CAQSM. Primary Care and Sports Medicine [place] [facility]  Adjunct Instructor of Family Medicine  [employer]

## 2020-02-18 ENCOUNTER — Other Ambulatory Visit: Payer: Self-pay | Admitting: Sports Medicine

## 2020-02-18 DIAGNOSIS — F419 Anxiety disorder, unspecified: Secondary | ICD-10-CM

## 2020-05-28 ENCOUNTER — Other Ambulatory Visit: Payer: Self-pay

## 2020-05-28 ENCOUNTER — Emergency Department
Admission: EM | Admit: 2020-05-28 | Discharge: 2020-05-28 | Disposition: A | Discharge status: Home / Self Care | Payer: PRIVATE HEALTH INSURANCE | Source: Home / Self Care

## 2020-05-28 ENCOUNTER — Encounter: Payer: Self-pay | Admitting: Emergency Medicine

## 2020-05-28 DIAGNOSIS — Z20822 Contact with and (suspected) exposure to covid-19: Secondary | ICD-10-CM | POA: Diagnosis not present

## 2020-05-28 NOTE — ED Triage Notes (Signed)
Covid exposure Vaccinated Asymptomatic

## 2020-05-29 LAB — SARS-COV-2 RNA,(COVID-19) QUALITATIVE NAAT: SARS CoV2 RNA: NOT DETECTED

## 2020-06-07 ENCOUNTER — Other Ambulatory Visit: Payer: Self-pay | Admitting: Sports Medicine

## 2020-06-07 DIAGNOSIS — F419 Anxiety disorder, unspecified: Secondary | ICD-10-CM

## 2020-06-07 DIAGNOSIS — F32A Depression, unspecified: Secondary | ICD-10-CM

## 2022-04-10 ENCOUNTER — Ambulatory Visit (INDEPENDENT_AMBULATORY_CARE_PROVIDER_SITE_OTHER): Payer: 59

## 2022-04-10 ENCOUNTER — Encounter: Payer: Self-pay | Admitting: Sports Medicine

## 2022-04-10 ENCOUNTER — Ambulatory Visit (INDEPENDENT_AMBULATORY_CARE_PROVIDER_SITE_OTHER): Payer: 59 | Admitting: Sports Medicine

## 2022-04-10 DIAGNOSIS — F32A Depression, unspecified: Secondary | ICD-10-CM | POA: Diagnosis not present

## 2022-04-10 DIAGNOSIS — F419 Anxiety disorder, unspecified: Secondary | ICD-10-CM

## 2022-04-10 DIAGNOSIS — M5412 Radiculopathy, cervical region: Secondary | ICD-10-CM | POA: Diagnosis not present

## 2022-04-10 MED ORDER — BUPROPION HCL ER (XL) 150 MG PO TB24: 150.0000 mg | ORAL_TABLET | ORAL | 3 refills | Status: DC

## 2022-04-10 MED ORDER — PREDNISONE 50 MG PO TABS: ORAL_TABLET | ORAL | 0 refills | Status: DC

## 2022-04-10 NOTE — Assessment & Plan Note (Signed)
Left C7 versus C8 distribution radiculitis, adding prednisone, x-rays, home conditioning, return to see me in 4 to 6 weeks for this.

## 2022-04-10 NOTE — Progress Notes (Signed)
    Procedures performed today:    None.  Independent interpretation of notes and tests performed by another provider:   None.  Brief History, Exam, Impression, and Recommendations:    Anxiety and depression Carlos Harvey returns, he is a pleasant 31 year old male, we have been treating him for anxiety and depression for some time now, historically on Zoloft but stopped this due to sexual dysfunction. Unfortunately his mood disorder has worsened, he has had some thoughts of suicide but no plans to carry them out. On further questioning in addition to common depressive symptoms he also has had some symptoms of mania raising suspicion for bipolar rather than unipolar depression. We will start Wellbutrin as this can be helpful for depression and does not carry the sexual side effects, however we will have a low threshold for adding a mood stabilizer. I do think he needs to touch base with a psychiatrist, as well as a psychologist as he had some questions about ADHD, autism spectrum disorder, PTSD. Return to see me in approximately 4 weeks for dose titration. Contracted for safety.  Radiculitis of left cervical region Left C7 versus C8 distribution radiculitis, adding prednisone, x-rays, home conditioning, return to see me in 4 to 6 weeks for this.  I spent 40 minutes of total time managing this patient today, this includes chart review, face to face, and non-face to face time.  ____________________________________________ Carlos Harvey. Carlos Harvey, M.D., ABFM., CAQSM., AME. Primary Care and Sports Medicine [facility]  Adjunct Professor of Family Medicine  [place] [employer] [facility] [employer]  Restaurant manager, fast food

## 2022-04-10 NOTE — Assessment & Plan Note (Signed)
Bain returns, he is a pleasant 31 year old male, we have been treating him for anxiety and depression for some time now, historically on Zoloft but stopped this due to sexual dysfunction. Unfortunately his mood disorder has worsened, he has had some thoughts of suicide but no plans to carry them out. On further questioning in addition to common depressive symptoms he also has had some symptoms of mania raising suspicion for bipolar rather than unipolar depression. We will start Wellbutrin as this can be helpful for depression and does not carry the sexual side effects, however we will have a low threshold for adding a mood stabilizer. I do think he needs to touch base with a psychiatrist, as well as a psychologist as he had some questions about ADHD, autism spectrum disorder, PTSD. Return to see me in approximately 4 weeks for dose titration. Contracted for safety.

## 2022-04-21 ENCOUNTER — Telehealth: Payer: Self-pay

## 2022-04-21 NOTE — Telephone Encounter (Signed)
Yes continue Wellbutrin and push through any odd sensations for now, no changes for now.  It is probably the prednisone more than anything else.

## 2022-04-21 NOTE — Telephone Encounter (Signed)
Patient called to report that since starting the wellbutrin he is feeling anxious and sweating throughout the day. Also, after finishing the prednisone, his shoulder is having some numbness. His question is whether or not he should follow up sooner due to these issues.

## 2022-04-23 NOTE — Telephone Encounter (Signed)
Patient aware of Dr. Karie Schwalbe recommendations. Verbalized understanding.

## 2022-05-08 ENCOUNTER — Ambulatory Visit: Payer: 59 | Admitting: Sports Medicine

## 2022-05-08 DIAGNOSIS — M5412 Radiculopathy, cervical region: Secondary | ICD-10-CM

## 2022-05-08 DIAGNOSIS — F32A Depression, unspecified: Secondary | ICD-10-CM | POA: Diagnosis not present

## 2022-05-08 DIAGNOSIS — F419 Anxiety disorder, unspecified: Secondary | ICD-10-CM | POA: Diagnosis not present

## 2022-05-08 MED ORDER — ARIPIPRAZOLE 5 MG PO TABS: ORAL_TABLET | ORAL | 3 refills | Status: AC

## 2022-05-08 MED ORDER — PREDNISONE 10 MG (48) PO TBPK: ORAL_TABLET | Freq: Every day | ORAL | 0 refills | Status: AC

## 2022-05-08 NOTE — Assessment & Plan Note (Signed)
Carlos Harvey also has neck pain with a left-sided C7 versus C8 distribution radiculitis, he did really well on the 5 days of prednisone with almost 0 pain. Unfortunately he does have a recurrence of discomfort. He does have significant allodynia and hyperalgesia to palpation of his fairly far musculature. He did not experience any psychiatric side effects from the prednisone. Refilling but with a 12-day taper.

## 2022-05-08 NOTE — Assessment & Plan Note (Addendum)
Carlos Harvey returns, we have been treating her for a mood disorder, he does have some symptoms of mania that raised the suspicion for bipolar rather than unipolar depression, he had stopped Zoloft in the past due to sexual dysfunction. He was currently depressive phase at the last visit so we added Wellbutrin which created flat feelings and intolerable irritability. At this point we will go ahead and start Abilify as a mood stabilizer, 5 mg nightly for a week and then 10 mg nightly. He has gotten a phone call from psychiatry and plans to see them but has not seen them yet. I like to see him back in 4 weeks and if he has not had sufficient improvement we will switch to Vraylar after failure of a generic mood stabilizer. He does contract for safety.

## 2022-05-08 NOTE — Progress Notes (Signed)
    Procedures performed today:    None.  Independent interpretation of notes and tests performed by another provider:   None.  Brief History, Exam, Impression, and Recommendations:    Anxiety and depression Dick returns, we have been treating her for a mood disorder, he does have some symptoms of mania that raised the suspicion for bipolar rather than unipolar depression, he had stopped Zoloft in the past due to sexual dysfunction. He was currently depressive phase at the last visit so we added Wellbutrin which created flat feelings and intolerable irritability. At this point we will go ahead and start Abilify as a mood stabilizer, 5 mg nightly for a week and then 10 mg nightly. He has gotten a phone call from psychiatry and plans to see them but has not seen them yet. I like to see him back in 4 weeks and if he has not had sufficient improvement we will switch to Vraylar after failure of a generic mood stabilizer. He does contract for safety.  Radiculitis of left cervical region Garey also has neck pain with a left-sided C7 versus C8 distribution radiculitis, he did really well on the 5 days of prednisone with almost 0 pain. Unfortunately he does have a recurrence of discomfort. He does have significant allodynia and hyperalgesia to palpation of his fairly far musculature. He did not experience any psychiatric side effects from the prednisone. Refilling but with a 12-day taper.    ____________________________________________ Gwen Her. Dianah Field, M.D., ABFM., CAQSM., AME. Primary Care and Sports Medicine [facility]  Adjunct Professor of [place] [employer] [facility] [employer]  Risk manager

## 2022-05-11 ENCOUNTER — Telehealth: Payer: Self-pay

## 2022-05-11 NOTE — Telephone Encounter (Addendum)
Initiated Prior authorization FPU:[id number] 5MG tablets Liviniti Call-In Form Via: Called [phone] Case/Key:[id number]/A Status: denied as of 05/11/22 Reason:awaiting fax,Medical criteria not met Notified Pt via: Mychart

## 2022-06-05 ENCOUNTER — Ambulatory Visit: Payer: 59 | Admitting: Sports Medicine

## 2024-04-18 ENCOUNTER — Encounter: Payer: Self-pay | Admitting: Sports Medicine
